# Patient Record
Sex: Male | Born: 1945 | Race: White | Hispanic: No | Marital: Married | Smoking: Current every day smoker
Health system: Southern US, Community
[De-identification: ages and names within clinical notes are randomized; demographics above are authoritative.]

## PROBLEM LIST (undated history)

## (undated) DIAGNOSIS — Z8679 Personal history of other diseases of the circulatory system: Secondary | ICD-10-CM

## (undated) DIAGNOSIS — J449 Chronic obstructive pulmonary disease, unspecified: Secondary | ICD-10-CM

## (undated) DIAGNOSIS — I10 Essential (primary) hypertension: Secondary | ICD-10-CM

## (undated) DIAGNOSIS — E7800 Pure hypercholesterolemia, unspecified: Secondary | ICD-10-CM

## (undated) DIAGNOSIS — E669 Obesity, unspecified: Secondary | ICD-10-CM

## (undated) DIAGNOSIS — M1990 Unspecified osteoarthritis, unspecified site: Secondary | ICD-10-CM

## (undated) DIAGNOSIS — R0789 Other chest pain: Secondary | ICD-10-CM

## (undated) DIAGNOSIS — Z86010 Personal history of colonic polyps: Secondary | ICD-10-CM

## (undated) DIAGNOSIS — G4730 Sleep apnea, unspecified: Secondary | ICD-10-CM

## (undated) HISTORY — PX: ANTRAL WINDOW: SHX5192

## (undated) HISTORY — PX: UMBILICAL HERNIA REPAIR: SHX196

## (undated) HISTORY — PX: KNEE ARTHROSCOPY: SUR90

## (undated) HISTORY — PX: FUNCTIONAL ENDOSCOPIC SINUS SURGERY: SUR616

## (undated) HISTORY — PX: HERNIA REPAIR: SHX51

## (undated) HISTORY — PX: JOINT REPLACEMENT: SHX530

## (undated) HISTORY — PX: TONSILLECTOMY: SUR1361

---

## 2005-09-28 ENCOUNTER — Ambulatory Visit: Payer: Self-pay | Admitting: Internal Medicine

## 2009-11-02 ENCOUNTER — Ambulatory Visit: Payer: Self-pay | Admitting: Internal Medicine

## 2010-06-05 ENCOUNTER — Inpatient Hospital Stay: Payer: Self-pay | Admitting: Internal Medicine

## 2010-08-29 ENCOUNTER — Ambulatory Visit: Payer: Self-pay | Admitting: Surgery

## 2010-09-06 ENCOUNTER — Ambulatory Visit: Payer: Self-pay | Admitting: Gastroenterology

## 2010-09-15 ENCOUNTER — Ambulatory Visit: Payer: Self-pay | Admitting: Surgery

## 2010-09-21 ENCOUNTER — Ambulatory Visit: Payer: Self-pay | Admitting: Surgery

## 2010-09-26 LAB — PATHOLOGY REPORT

## 2011-05-24 ENCOUNTER — Ambulatory Visit: Payer: Self-pay

## 2011-10-05 ENCOUNTER — Ambulatory Visit: Payer: Self-pay

## 2014-09-08 ENCOUNTER — Ambulatory Visit: Payer: Self-pay | Admitting: Internal Medicine

## 2015-01-10 ENCOUNTER — Emergency Department
Admit: 2015-01-10 | Disposition: A | Discharge status: Home / Self Care | Payer: Self-pay | Admitting: Emergency Medicine

## 2015-02-18 ENCOUNTER — Ambulatory Visit
Admission: RE | Admit: 2015-02-18 | Discharge: 2015-02-18 | Disposition: A | Discharge status: Home / Self Care | Payer: BLUE CROSS/BLUE SHIELD | Source: Ambulatory Visit | Attending: Gastroenterology | Admitting: Gastroenterology

## 2015-02-18 ENCOUNTER — Encounter: Payer: Self-pay | Admitting: *Deleted

## 2015-02-18 ENCOUNTER — Ambulatory Visit: Payer: BLUE CROSS/BLUE SHIELD | Admitting: Anesthesiology

## 2015-02-18 ENCOUNTER — Encounter
Admission: RE | Disposition: A | Discharge status: Home / Self Care | Payer: Self-pay | Source: Ambulatory Visit | Attending: Gastroenterology

## 2015-02-18 DIAGNOSIS — D124 Benign neoplasm of descending colon: Secondary | ICD-10-CM | POA: Insufficient documentation

## 2015-02-18 DIAGNOSIS — D175 Benign lipomatous neoplasm of intra-abdominal organs: Secondary | ICD-10-CM | POA: Insufficient documentation

## 2015-02-18 DIAGNOSIS — D125 Benign neoplasm of sigmoid colon: Secondary | ICD-10-CM | POA: Diagnosis not present

## 2015-02-18 DIAGNOSIS — Z86010 Personal history of colonic polyps: Secondary | ICD-10-CM | POA: Insufficient documentation

## 2015-02-18 DIAGNOSIS — I10 Essential (primary) hypertension: Secondary | ICD-10-CM | POA: Diagnosis not present

## 2015-02-18 DIAGNOSIS — E780 Pure hypercholesterolemia: Secondary | ICD-10-CM | POA: Insufficient documentation

## 2015-02-18 DIAGNOSIS — G4730 Sleep apnea, unspecified: Secondary | ICD-10-CM | POA: Insufficient documentation

## 2015-02-18 DIAGNOSIS — I4891 Unspecified atrial fibrillation: Secondary | ICD-10-CM | POA: Insufficient documentation

## 2015-02-18 HISTORY — DX: Pure hypercholesterolemia, unspecified: E78.00

## 2015-02-18 HISTORY — DX: Sleep apnea, unspecified: G47.30

## 2015-02-18 HISTORY — PX: COLONOSCOPY: SHX5424

## 2015-02-18 HISTORY — DX: Essential (primary) hypertension: I10

## 2015-02-18 SURGERY — COLONOSCOPY
Anesthesia: General

## 2015-02-18 MED ORDER — PROPOFOL INFUSION 10 MG/ML OPTIME
INTRAVENOUS | Status: DC | PRN
  Administered 2015-02-18: 140 ug/kg/min via INTRAVENOUS

## 2015-02-18 MED ORDER — LIDOCAINE HCL (CARDIAC) 20 MG/ML IV SOLN
INTRAVENOUS | Status: DC | PRN
Start: 2015-02-18 — End: 2015-02-18
  Administered 2015-02-18: 50 mg via INTRAVENOUS

## 2015-02-18 MED ORDER — PROPOFOL 10 MG/ML IV BOLUS
INTRAVENOUS | Status: DC | PRN
  Administered 2015-02-18: 100 mg via INTRAVENOUS

## 2015-02-18 MED ORDER — SODIUM CHLORIDE 0.9 % IV SOLN: INTRAVENOUS | Status: DC

## 2015-02-18 MED ORDER — LACTATED RINGERS IV SOLN
INTRAVENOUS | Status: DC | PRN
  Administered 2015-02-18: 08:00:00 via INTRAVENOUS

## 2015-02-18 NOTE — Op Note (Signed)
[facility] Gastroenterology Patient Name: Daniel Boyer Procedure Date: 02/18/2015 8:02 AM MRN: [id number] Account #: [id number] Date of Birth: Sep 28, 1946 Admit Type: Outpatient Age: 69 Room: [facility] ENDO ROOM 4 Gender: Male Note Status: Finalized Procedure:         Colonoscopy Indications:       Personal history of colonic polyps Providers:         Lupita Dawn. Candace Cruise, MD Referring MD:      Leonie Douglas. Doy Hutching, MD (Referring MD) Medicines:         Monitored Anesthesia Care Procedure:         Pre-Anesthesia Assessment:                    - Prior to the procedure, a History and Physical was                     performed, and patient medications, allergies and                     sensitivities were reviewed. The patient's tolerance of                     previous anesthesia was reviewed.                    - The risks and benefits of the procedure and the sedation                     options and risks were discussed with the patient. All                     questions were answered and informed consent was obtained.                    - After reviewing the risks and benefits, the patient was                     deemed in satisfactory condition to undergo the procedure.                    After obtaining informed consent, the colonoscope was                     passed under direct vision. Throughout the procedure, the                     patient's blood pressure, pulse, and oxygen saturations                     were monitored continuously. The Colonoscope was                     introduced through the anus and advanced to the the cecum,                     identified by appendiceal orifice and ileocecal valve. The                     colonoscopy was performed without difficulty. The patient                     tolerated the procedure well. The quality of the bowel  preparation was good. Findings:      A medium polyp was found in the descending colon. The polyp  was       pedunculated. The polyp was removed with a hot snare. Resection and       retrieval were complete.      There was a small lipoma, in the ascending colon.      A small polypoid lesion was found in the sigmoid colon. May just be a       prominnt fold. The lesion was sessile. The polyp was removed with a hot       snare. Polyp resection was incomplete. The resected tissue was retrieved.      The exam was otherwise without abnormality. Impression:        - One medium polyp in the descending colon. Resected and                     retrieved.                    - Small lipoma in the ascending colon.                    - Benign fold in the sigmoid colon. Tissue was removed.                    - The examination was otherwise normal. Recommendation:    - Discharge patient to home.                    - Await pathology results.                    - Repeat colonoscopy in 5 years for surveillance.                    - The findings and recommendations were discussed with the                     patient. Procedure Code(s): --- Professional ---                    [phone], Colonoscopy, flexible; with removal of tumor(s),                     polyp(s), or other lesion(s) by snare technique Diagnosis Code(s): --- Professional ---                    D12.4, Benign neoplasm of descending colon                    D17.5, Benign lipomatous neoplasm of intra-abdominal organs                    D12.5, Benign neoplasm of sigmoid colon                    Z86.010, Personal history of colonic polyps CPT copyright 2014 [employer]. All rights reserved. The codes documented in this report are preliminary and upon coder review may  be revised to meet current compliance requirements. Hulen Luster, MD 02/18/2015 8:30:57 AM This report has been signed electronically. Number of Addenda: 0 Note Initiated On: 02/18/2015 8:02 AM Scope Withdrawal Time: 0 hours 13 minutes 34 seconds  Total Procedure  Duration: 0 hours 15 minutes 38 seconds       [facility]

## 2015-02-18 NOTE — Anesthesia Postprocedure Evaluation (Signed)
  Anesthesia Post-op Note  Patient: Daniel Boyer.  Procedure(s) Performed: Procedure(s): COLONOSCOPY (N/A)  Anesthesia type:General  Patient location: PACU  Post pain: Pain level controlled  Post assessment: Post-op Vital signs reviewed, Patient's Cardiovascular Status Stable, Respiratory Function Stable, Patent Airway and No signs of Nausea or vomiting  Post vital signs: Reviewed and stable  Last Vitals:  Filed Vitals:   02/18/15 0835  BP: 109/63  Pulse: 75  Temp: 35.8 C  Resp: 17    Level of consciousness: awake, alert  and patient cooperative  Complications: No apparent anesthesia complications

## 2015-02-18 NOTE — H&P (Signed)
    Primary Care Physician:  Idelle Crouch, MD Primary Gastroenterologist:  Dr. Candace Cruise  Pre-Procedure History & Physical: HPI:  Daniel Boyer. is a 69 y.o. male is here for colonoscopy.   Past Medical History  Diagnosis Date  . Hypertension   . Sleep apnea   . Hypercholesterolemia     Past Surgical History  Procedure Laterality Date  . Umbilical hernia repair    . Knee arthroscopy    . Antral window      Prior to Admission medications   Medication Sig Start Date End Date Taking? Authorizing Provider  aspirin 81 MG chewable tablet Chew 81 mg by mouth daily.   Yes Historical Provider, MD  Fish Oil OIL Take 1 tablet by mouth daily.   Yes Historical Provider, MD  fluticasone (FLONASE) 50 MCG/ACT nasal spray Place 1 spray into both nostrils daily.   Yes Historical Provider, MD  GAVILYTE-N WITH FLAVOR PACK 420 G solution Take by mouth. Colonoscopy prep 01/20/15  Yes Historical Provider, MD  glucosamine-chondroitin 500-400 MG tablet Take 2 tablets by mouth daily.   Yes Historical Provider, MD  metoprolol succinate (TOPROL-XL) 100 MG 24 hr tablet Take 1 tablet by mouth daily. 01/17/15  Yes Historical Provider, MD  pravastatin (PRAVACHOL) 40 MG tablet Take 40 mg by mouth daily. 01/17/15  Yes Historical Provider, MD    Allergies as of 01/26/2015  . (Not on File)    No family history on file.  History   Social History  . Marital Status: Married    Spouse Name: N/A  . Number of Children: N/A  . Years of Education: N/A   Occupational History  . Not on file.   Social History Main Topics  . Smoking status: Not on file  . Smokeless tobacco: Not on file  . Alcohol Use: Not on file  . Drug Use: Not on file  . Sexual Activity: Not on file   Other Topics Concern  . Not on file   Social History Narrative  . No narrative on file    Review of Systems: See HPI, otherwise negative ROS  Physical Exam: BP 119/56 mmHg  Pulse 87  Temp(Src) 97.2 F (36.2 C) (Tympanic)  Resp 20   Ht 6' (1.829 m)  Wt 143.79 kg (317 lb)  BMI 42.98 kg/m2  SpO2 94% General:   Alert,  pleasant and cooperative in NAD Head:  Normocephalic and atraumatic. Neck:  Supple; no masses or thyromegaly. Lungs:  Clear throughout to auscultation.    Heart:  Regular rate and rhythm. Abdomen:  Soft, nontender and nondistended. Normal bowel sounds, without guarding, and without rebound.   Neurologic:  Alert and  oriented x4;  grossly normal neurologically.  Impression/Plan: Daniel Boyer. is here for colonoscopy to be performed for personal hx of colon polyps.  Risks, benefits, limitations, and alternatives regarding colonoscopy have been reviewed with the patient.  Questions have been answered.  All parties agreeable.   Daniel Boyer, Daniel Dawn, MD  02/18/2015, 7:58 AM

## 2015-02-18 NOTE — Anesthesia Preprocedure Evaluation (Addendum)
Anesthesia Evaluation  Patient identified by MRN, date of birth, ID band Patient awake    Reviewed: Allergy & Precautions, NPO status , Patient's Chart, lab work & pertinent test results  Airway Mallampati: II  TM Distance: >3 FB Neck ROM: Full    Dental   Pulmonary sleep apnea and Continuous Positive Airway Pressure Ventilation ,          Cardiovascular hypertension, Pt. on medications and Pt. on home beta blockers + dysrhythmias (afib with the flu) Atrial Fibrillation     Neuro/Psych    GI/Hepatic   Endo/Other    Renal/GU      Musculoskeletal   Abdominal   Peds  Hematology   Anesthesia Other Findings   Reproductive/Obstetrics                            Anesthesia Physical Anesthesia Plan  ASA: III  Anesthesia Plan: General   Post-op Pain Management:    Induction: Intravenous  Airway Management Planned: Nasal Cannula  Additional Equipment:   Intra-op Plan:   Post-operative Plan:   Informed Consent: I have reviewed the patients History and Physical, chart, labs and discussed the procedure including the risks, benefits and alternatives for the proposed anesthesia with the patient or authorized representative who has indicated his/her understanding and acceptance.     Plan Discussed with:   Anesthesia Plan Comments:         Anesthesia Quick Evaluation

## 2015-02-18 NOTE — Transfer of Care (Signed)
Immediate Anesthesia Transfer of Care Note  Patient: Daniel Boyer.  Procedure(s) Performed: Procedure(s): COLONOSCOPY (N/A)  Patient Location: PACU and Endoscopy Unit  Anesthesia Type:General  Level of Consciousness: awake and alert   Airway & Oxygen Therapy: Patient Spontanous Breathing and Patient connected to nasal cannula oxygen  Post-op Assessment: Report given to RN and Post -op Vital signs reviewed and stable  Post vital signs: stable  Last Vitals:  Filed Vitals:   02/18/15 0835  BP: 109/63  Pulse: 75  Temp: 35.8 C  Resp: 17    Complications: No apparent anesthesia complications

## 2015-02-21 LAB — SURGICAL PATHOLOGY

## 2015-03-01 ENCOUNTER — Encounter: Payer: Self-pay | Admitting: Gastroenterology

## 2016-02-09 ENCOUNTER — Other Ambulatory Visit: Payer: Self-pay | Admitting: Orthopedic Surgery

## 2016-02-09 DIAGNOSIS — M1711 Unilateral primary osteoarthritis, right knee: Secondary | ICD-10-CM

## 2016-02-28 ENCOUNTER — Ambulatory Visit
Admission: RE | Admit: 2016-02-28 | Discharge: 2016-02-28 | Disposition: A | Discharge status: Home / Self Care | Payer: BLUE CROSS/BLUE SHIELD | Source: Ambulatory Visit | Attending: Orthopedic Surgery | Admitting: Orthopedic Surgery

## 2016-02-28 DIAGNOSIS — M1711 Unilateral primary osteoarthritis, right knee: Secondary | ICD-10-CM

## 2016-03-12 ENCOUNTER — Other Ambulatory Visit: Payer: Self-pay | Admitting: Physician Assistant

## 2016-03-12 ENCOUNTER — Ambulatory Visit
Admission: RE | Admit: 2016-03-12 | Discharge: 2016-03-12 | Disposition: A | Discharge status: Home / Self Care | Payer: BLUE CROSS/BLUE SHIELD | Source: Ambulatory Visit | Attending: Physician Assistant | Admitting: Physician Assistant

## 2016-03-12 DIAGNOSIS — R1032 Left lower quadrant pain: Secondary | ICD-10-CM | POA: Diagnosis not present

## 2016-03-12 DIAGNOSIS — R935 Abnormal findings on diagnostic imaging of other abdominal regions, including retroperitoneum: Secondary | ICD-10-CM | POA: Diagnosis not present

## 2016-03-12 MED ORDER — IOPAMIDOL (ISOVUE-300) INJECTION 61%: 125.0000 mL | Freq: Once | INTRAVENOUS | Status: DC | PRN

## 2016-03-12 MED ORDER — IOPAMIDOL (ISOVUE-300) INJECTION 61%
125.0000 mL | Freq: Once | INTRAVENOUS | Status: AC | PRN
Start: 2016-03-12 — End: 2016-03-12
  Administered 2016-03-12: 125 mL via INTRAVENOUS

## 2016-05-09 ENCOUNTER — Encounter
Admission: RE | Admit: 2016-05-09 | Discharge: 2016-05-09 | Disposition: A | Discharge status: Home / Self Care | Payer: BLUE CROSS/BLUE SHIELD | Source: Ambulatory Visit | Attending: Orthopedic Surgery | Admitting: Orthopedic Surgery

## 2016-05-09 DIAGNOSIS — Z01812 Encounter for preprocedural laboratory examination: Secondary | ICD-10-CM | POA: Insufficient documentation

## 2016-05-09 HISTORY — DX: Unspecified osteoarthritis, unspecified site: M19.90

## 2016-05-09 LAB — SEDIMENTATION RATE: Sed Rate: 26 mm/hr — ABNORMAL HIGH (ref 0–20)

## 2016-05-09 LAB — URINALYSIS COMPLETE WITH MICROSCOPIC (ARMC ONLY)
Bacteria, UA: NONE SEEN
Bilirubin Urine: NEGATIVE
Glucose, UA: NEGATIVE mg/dL
HGB URINE DIPSTICK: NEGATIVE
KETONES UR: NEGATIVE mg/dL
LEUKOCYTES UA: NEGATIVE
Nitrite: NEGATIVE
PH: 5 (ref 5.0–8.0)
Protein, ur: NEGATIVE mg/dL
RBC / HPF: NONE SEEN RBC/hpf (ref 0–5)
SQUAMOUS EPITHELIAL / LPF: NONE SEEN
Specific Gravity, Urine: 1.012 (ref 1.005–1.030)

## 2016-05-09 LAB — BASIC METABOLIC PANEL
Anion gap: 6 (ref 5–15)
BUN: 14 mg/dL (ref 6–20)
CHLORIDE: 108 mmol/L (ref 101–111)
CO2: 25 mmol/L (ref 22–32)
Calcium: 8.6 mg/dL — ABNORMAL LOW (ref 8.9–10.3)
Creatinine, Ser: 0.92 mg/dL (ref 0.61–1.24)
GFR calc Af Amer: >60 mL/min (ref >60)
GFR calc non Af Amer: >60 mL/min (ref >60)
GLUCOSE: 89 mg/dL (ref 65–99)
POTASSIUM: 3.8 mmol/L (ref 3.5–5.1)
Sodium: 139 mmol/L (ref 135–145)

## 2016-05-09 LAB — CBC
HEMATOCRIT: 43.3 % (ref 40.0–52.0)
HEMOGLOBIN: 15.3 g/dL (ref 13.0–18.0)
MCH: 29.4 pg (ref 26.0–34.0)
MCHC: 35.4 g/dL (ref 32.0–36.0)
MCV: 83.2 fL (ref 80.0–100.0)
Platelets: 198 10*3/uL (ref 150–440)
RBC: 5.2 MIL/uL (ref 4.40–5.90)
RDW: 13.5 % (ref 11.5–14.5)
WBC: 9.1 10*3/uL (ref 3.8–10.6)

## 2016-05-09 LAB — SURGICAL PCR SCREEN
MRSA, PCR: NEGATIVE
STAPHYLOCOCCUS AUREUS: NEGATIVE

## 2016-05-09 LAB — PROTIME-INR
INR: 0.93
PROTHROMBIN TIME: 12.5 s (ref 11.4–15.2)

## 2016-05-09 LAB — TYPE AND SCREEN
ABO/RH(D): O POS
Antibody Screen: NEGATIVE

## 2016-05-09 LAB — APTT: APTT: 30 s (ref 24–36)

## 2016-05-09 NOTE — Pre-Procedure Instructions (Signed)
Component Name Value Ref Range  Vent Rate (bpm) 69   PR Interval (msec) 186   QRS Interval (msec) 86   QT Interval (msec) 392   QTc (msec) 420   Result Narrative  Normal sinus rhythm Possible Anterior infarct , age undetermined Abnormal ECG No previous ECGs available I reviewed and concur with this report. Electronically signed [id number] MD, Darnell Level [phone]) on 05/02/2016 1:48:30 PM

## 2016-05-09 NOTE — Patient Instructions (Signed)
  Your procedure is scheduled on: 05/22/16 Tues Report to Same Day Surgery 2nd floor medical mall To find out your arrival time please call [phone] between 1PM - 3PM on 05/21/16 Mon  Remember: Instructions that are not followed completely may result in serious medical risk, up to and including death, or upon the discretion of your surgeon and anesthesiologist your surgery may need to be rescheduled.    _x___ 1. Do not eat food or drink liquids after midnight. No gum chewing or hard candies.     __x__ 2. No Alcohol for 24 hours before or after surgery.   __x__3. No Smoking for 24 prior to surgery.   ____  4. Bring all medications with you on the day of surgery if instructed.    __x__ 5. Notify your doctor if there is any change in your medical condition     (cold, fever, infections).     Do not wear jewelry, make-up, hairpins, clips or nail polish.  Do not wear lotions, powders, or perfumes. You may wear deodorant.  Do not shave 48 hours prior to surgery. Men may shave face and neck.  Do not bring valuables to the hospital.    [facility] is not responsible for any belongings or valuables.               Contacts, dentures or bridgework may not be worn into surgery.  Leave your suitcase in the car. After surgery it may be brought to your room.  For patients admitted to the hospital, discharge time is determined by your treatment team.   Patients discharged the day of surgery will not be allowed to drive home.    Please read over the following fact sheets that you were given:   [facility] Preparing for Surgery and or MRSA Information   _x___ Take these medicines the morning of surgery with A SIP OF WATER:    1. metoprolol succinate (TOPROL-XL) 100 MG 24 hr tablet  2.  3.  4.  5.  6.  ____ Fleet Enema (as directed)   _x___ Use CHG Soap or sage wipes as directed on instruction sheet   ____ Use inhalers on the day of surgery and bring to hospital day of surgery  ____  Stop metformin 2 days prior to surgery    ____ Take 1/2 of usual insulin dose the night before surgery and none on the morning of           surgery.   __x__ Stop aspirin or coumadin, or plavix Stop aspirin 1 week before surgery  _x__ Stop Anti-inflammatories such as Advil, Aleve, Ibuprofen, Motrin, Naproxen,          Naprosyn, Goodies powders or aspirin products. Ok to take Tylenol.   _x___ Stop supplements until after surgery.  Stop fish oil 1 week before surgery  ____ Bring C-Pap to the hospital.

## 2016-05-09 NOTE — Pre-Procedure Instructions (Signed)
[name], MD - 04/24/2016 3:15 PM EDT Formatting of this note may be different from the original. Established Patient Visit   Chief Complaint: Chief Complaint  Patient presents with  . Follow-up  POC total knee replacement  . Atrial Fibrillation  . Hypertension  Date of Service: 04/24/2016 Date of Birth: 05-18-1946 PCP: Idelle Crouch, MD, MD  History of Present Illness: Daniel Boyer is a 70 y.o.male patient  Atrial flutter The patient has non valvular atrial flutter which is Paroxysmal symptomatic with shortness of breath and dizziness currently taking metoprolol and Maintaining normal sinus rhythm. The atrial flutter has occurred at rest and may be associated with a fluttering sensation stable. The etiology to atrial flutter include Hypertension, structural heart disease and or LV dysfunction, valvular heart disease, lung disease and/or sleep apnea and sick sinus syndrome The patient has not been taking anticoagulation Tobacco use The patient was counseled on the risks of tobacco use and significant hazards. They have been informed of the continued cardiovascular risk of stroke, heart attack, and peripheral vascular disease. We have continued to stress the need for discontinuation and/or abstinence if able.  Sleep Apnea The patient has had a diagnosis of sleep apnea confirmed by formal sleep study in the past. The patient has been placed on a CPAP machine for which they have been diligent in its use. Recent evaluation has shown 100% compliance with an average of more than 7 hours of use per night. The patient has had significant benefit from the use of the CPAP machine with considerable improvements in quality of life, work production, and decreased medical complaints. The patient will need continued maintenance and care supplies as well as upgrades at appropriate times to continue helping with treatment of this diagnosis. Obesity The patient has been identified with class III (BMI  >40) obesity. There is concerns that the patient has a higher risk for cardiovascular disease, diabetes, morbidity, and mortality. We have discussed these concerns and the possible treatment options including options for weight loss. It is clear that even 3-5% loss in weight will lower these risks. We have also identified risk factors to modify and treat including sleep apnea, hypertention and hyperlipidemia  Perioperative RCRI Risk for Complications with Knee Surgery 1. High risk surgery (intraperitoneal, intrathoracic, Suprainguinal vascular)-no 2. History of make heart disease (history of myocardial infarction, history of positive exercise test, current chest pain considered due to myocardial ischemia, use of nitrate therapy, EKG changes with pathological Q-waves)-no 3. History of congestive heart failure (pulmonary edema, bilateral rales and or S3 gallop, paroxysmal nocturnal dyspnea, chest x-ray showing pulmonary vascular distribution)-no 4. History of cerebral vascular disease (prior TIA and/or stroke)-no 5. Pre-operative treatment with insulin-no 6. Pre-operative creatinine greater than 2 milligrams/deciliter-"no Resultant risk classification: Class I risk, 0.4% risk of major cardiac event  Past Medical and Surgical History  Past Medical History Past Medical History:  Diagnosis Date  . Atrial flutter , unspecified (CMS-HCC)  . COPD (chronic obstructive pulmonary disease) (CMS-HCC)  . Hyperlipidemia  . Hypertension  benign  . Non-cardiac chest pain  . Obesity  . Sleep apnea  on BIPAP  . Tobacco abuse  . 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   Medications and Allergies  Current Medications  Current Outpatient Prescriptions  Medication Sig Dispense Refill   . aspirin 81 MG EC  tablet Take 81 mg by mouth once daily.  . fluticasone (FLONASE) 50 mcg/actuation nasal spray Place 2 sprays into both nostrils once daily as needed for Rhinitis or Allergies. 16 g 3  . ibuprofen (ADVIL,MOTRIN) 200 MG tablet Take 400 mg by mouth as needed for Pain.  . metoprolol succinate (TOPROL-XL) 100 MG XL tablet take 1 tablet by mouth once daily 30 tablet 11  . omega-3 fatty acids/fish oil 340-1,000 mg capsule Take 1 capsule by mouth 2 (two) times daily.  . pravastatin (PRAVACHOL) 40 MG tablet take 1 tablet by mouth EVERY NIGHT 90 tablet 3  . predniSONE (DELTASONE) 10 MG tablet Taper 6-5-4-3-2-1-off 21 tablet 0  . sildenafil (REVATIO) 20 mg tablet 2-3 tablets daily as needed 30 tablet 3   No current facility-administered medications for this visit.   Allergies: Celebrex [celecoxib]  Social and Family History  Social History reports that he has been smoking Cigarettes. He has been smoking about 1.00 pack per day. He has never used smokeless tobacco. He reports that he does not drink alcohol or use illicit drugs.  Family History Family History  Problem Relation Age of Onset  . Heart attack Mother  . Heart attack Father  . Diabetes type II Father  . Coronary artery disease Other  . Diabetes type II Other  . Stroke Other   Review of Systems   Review of Systems  Positive for knee pain Negative for weight gain weight loss, weakness, vision change, hearing loss, cough, congestion, PND, orthopnea, heartburn, nausea, diaphoresis, vomiting, diarrhea, bloody stool, melena, stomach pain, leg cramping, leg blood clots, headache, blackouts, nosebleed, trouble swallowing, mouth pain, urinary frequency, urination at night, muscle weakness, skin lesions, skin rashes, tingling ,ulcers, numbness, anxiety, and/or depression Physical Examination   Vitals:BP 128/80  Pulse 69  Resp 14  Ht 177.8 cm (5\' 10" )  Wt (!) 143.8 kg (317 lb)  BMI 45.48 kg/m2 Ht:177.8 cm (5\' 10" )  Wt:(!) 143.8 kg (317 lb) [id number] surface area is 2.66 meters squared. Body mass index is 45.48 kg/(m^2). Appearance: well appearing in no acute distress HEENT: Pupils equally reactive to light and accomodation, no xanthalasma  Neck: Supple, no apparent thyromegaly, masses, or lymphadenopathy  Lungs: normal respiratory effort; no crackles, no rhonchi, no wheezes Heart: Regular rate and rhythm. Normal S1 S2 No gallops, murmur, no rub, PMI is normal size and placement. carotid upstroke normal Jugular venous pressure is normal Abdomen: soft, nontender, with normal bowel sounds. No apparent hepatosplenomegally. Abdominal aorta is normal size without bruit Extremities: no edema, no ulcers, no clubbing, no cyanosis Peripheral Pulses: 2+ in upper extremities, 1+ femoral pulses bilaterally, 0+lower extremity  Musculoskeletal; Normal muscle tone without kyphosis Neurological: Oriented and Alert, Cranial nerves intact  Assessment   70 y.o. male with  Encounter Diagnoses  Name Primary?  . Pre-operative clearance  . Atrial flutter, paroxysmal , unspecified (CMS-HCC)  . Morbid obesity due to excess calories (CMS-HCC) Yes  . OSA (obstructive sleep apnea)  . Tobacco abuse   Plan  -Proceed to surgery and/or invasive procedure without restriction to pre or post operative and/or procedural care. The patient is at lowest risk possible for cardiovascular complications with surgical intervention and/or invasive procedure. Currently has no evidence active and/or significant angina and/or congestive heart failure. The patient may discontinue aspirin 4 days prior to procedure and restart at a safe period thereafter -The patient will use metoprolol succinate (Toprol XL) for heart rate and or maintenance of normal sinus of atrial flutter. -Patient has had  council today on the significance of all forms of tobacco use and the related hazards. We have expressed the need to abstain from the use of tobacco products for  prevention of cardiovascular disease, peripheral vascular disease, and of cardiovascular events. -Diligent use of the CPAP machine for further risk reduction of cardiovascular event, congestive heart failure, shortness of breath, pulmonary hypertension, and lower extremity edema has been discussed today. The patient understands the importance of treatment of sleep apnea and will continue to work diligently on other measures including weight loss and exercise. -We have been able to council the patient on the concerns of increased morbidity and mortality of obesity and the steps that can be taken to reduce risk. This includes a lifestyle program, an exercise program, a diet program, and medical management of current cardiovascular risk factors.  Orders Placed This Encounter  Procedures  . ECG 12-lead   Return in about 6 months (around 10/25/2016).  [name], MD

## 2016-05-10 LAB — URINE CULTURE: Culture: NO GROWTH

## 2016-05-11 NOTE — Pre-Procedure Instructions (Signed)
Calcium level and sed rate sent to Dr. Rudene Christians and Anesthesia for review.

## 2016-05-16 ENCOUNTER — Other Ambulatory Visit: Payer: BLUE CROSS/BLUE SHIELD

## 2016-05-22 ENCOUNTER — Inpatient Hospital Stay
Admission: RE | Admit: 2016-05-22 | Discharge: 2016-05-24 | DRG: 470 | Disposition: A | Discharge status: Home Health | Payer: BLUE CROSS/BLUE SHIELD | Source: Ambulatory Visit | Attending: Orthopedic Surgery | Admitting: Orthopedic Surgery

## 2016-05-22 ENCOUNTER — Inpatient Hospital Stay: Payer: BLUE CROSS/BLUE SHIELD | Admitting: Anesthesiology

## 2016-05-22 ENCOUNTER — Encounter
Admission: RE | Disposition: A | Discharge status: Home Health | Payer: Self-pay | Source: Ambulatory Visit | Attending: Orthopedic Surgery

## 2016-05-22 ENCOUNTER — Inpatient Hospital Stay: Payer: BLUE CROSS/BLUE SHIELD

## 2016-05-22 DIAGNOSIS — F17200 Nicotine dependence, unspecified, uncomplicated: Secondary | ICD-10-CM | POA: Diagnosis present

## 2016-05-22 DIAGNOSIS — Z8249 Family history of ischemic heart disease and other diseases of the circulatory system: Secondary | ICD-10-CM

## 2016-05-22 DIAGNOSIS — Z79899 Other long term (current) drug therapy: Secondary | ICD-10-CM

## 2016-05-22 DIAGNOSIS — G8918 Other acute postprocedural pain: Secondary | ICD-10-CM

## 2016-05-22 DIAGNOSIS — J449 Chronic obstructive pulmonary disease, unspecified: Secondary | ICD-10-CM | POA: Diagnosis present

## 2016-05-22 DIAGNOSIS — M1711 Unilateral primary osteoarthritis, right knee: Secondary | ICD-10-CM | POA: Diagnosis present

## 2016-05-22 DIAGNOSIS — M25561 Pain in right knee: Secondary | ICD-10-CM | POA: Diagnosis present

## 2016-05-22 DIAGNOSIS — E785 Hyperlipidemia, unspecified: Secondary | ICD-10-CM | POA: Diagnosis present

## 2016-05-22 DIAGNOSIS — Z7982 Long term (current) use of aspirin: Secondary | ICD-10-CM

## 2016-05-22 DIAGNOSIS — I10 Essential (primary) hypertension: Secondary | ICD-10-CM | POA: Diagnosis present

## 2016-05-22 DIAGNOSIS — G4730 Sleep apnea, unspecified: Secondary | ICD-10-CM | POA: Diagnosis present

## 2016-05-22 DIAGNOSIS — Z888 Allergy status to other drugs, medicaments and biological substances status: Secondary | ICD-10-CM | POA: Diagnosis not present

## 2016-05-22 HISTORY — PX: TOTAL KNEE ARTHROPLASTY: SHX125

## 2016-05-22 LAB — CBC
HEMATOCRIT: 42 % (ref 40.0–52.0)
Hemoglobin: 14.7 g/dL (ref 13.0–18.0)
MCH: 29.5 pg (ref 26.0–34.0)
MCHC: 35 g/dL (ref 32.0–36.0)
MCV: 84.1 fL (ref 80.0–100.0)
PLATELETS: 175 10*3/uL (ref 150–440)
RBC: 4.99 MIL/uL (ref 4.40–5.90)
RDW: 13.6 % (ref 11.5–14.5)
WBC: 9 10*3/uL (ref 3.8–10.6)

## 2016-05-22 LAB — CREATININE, SERUM
Creatinine, Ser: 0.81 mg/dL (ref 0.61–1.24)
GFR calc Af Amer: >60 mL/min (ref >60)
GFR calc non Af Amer: >60 mL/min (ref >60)

## 2016-05-22 SURGERY — ARTHROPLASTY, KNEE, TOTAL
Anesthesia: Spinal | Site: Knee | Laterality: Right | Wound class: Clean

## 2016-05-22 MED ORDER — FENTANYL CITRATE (PF) 100 MCG/2ML IJ SOLN
25.0000 ug | INTRAMUSCULAR | Status: AC | PRN
  Administered 2016-05-22 (×6): 25 ug via INTRAVENOUS

## 2016-05-22 MED ORDER — METOPROLOL SUCCINATE ER 50 MG PO TB24
100.0000 mg | ORAL_TABLET | Freq: Every day | ORAL | Status: DC
Start: 2016-05-23 — End: 2016-05-24
  Administered 2016-05-23 – 2016-05-24 (×2): 100 mg via ORAL
  Filled 2016-05-22 (×2): qty 2

## 2016-05-22 MED ORDER — BUPIVACAINE-EPINEPHRINE (PF) 0.25% -1:200000 IJ SOLN
INTRAMUSCULAR | Status: DC | PRN
  Administered 2016-05-22: 30 mL

## 2016-05-22 MED ORDER — LACTATED RINGERS IV SOLN
INTRAVENOUS | Status: DC
  Administered 2016-05-22 (×2): via INTRAVENOUS

## 2016-05-22 MED ORDER — PRAVASTATIN SODIUM 40 MG PO TABS
40.0000 mg | ORAL_TABLET | Freq: Every day | ORAL | Status: DC
  Administered 2016-05-22 – 2016-05-23 (×2): 40 mg via ORAL
  Filled 2016-05-22 (×2): qty 1

## 2016-05-22 MED ORDER — CEFAZOLIN SODIUM 10 G IJ SOLR
3.0000 g | Freq: Four times a day (QID) | INTRAMUSCULAR | Status: AC
  Administered 2016-05-22 – 2016-05-23 (×3): 3 g via INTRAVENOUS
  Filled 2016-05-22 (×3): qty 3000

## 2016-05-22 MED ORDER — SODIUM CHLORIDE 0.9 % IJ SOLN
INTRAMUSCULAR | Status: AC
  Filled 2016-05-22: qty 100

## 2016-05-22 MED ORDER — SODIUM CHLORIDE 0.9 % IJ SOLN
INTRAMUSCULAR | Status: DC | PRN
  Administered 2016-05-22: 30 mL

## 2016-05-22 MED ORDER — ONDANSETRON HCL 4 MG/2ML IJ SOLN: 4.0000 mg | Freq: Once | INTRAMUSCULAR | Status: DC | PRN

## 2016-05-22 MED ORDER — ACETAMINOPHEN 650 MG RE SUPP: 650.0000 mg | Freq: Four times a day (QID) | RECTAL | Status: DC | PRN

## 2016-05-22 MED ORDER — ASPIRIN 81 MG PO CHEW
81.0000 mg | CHEWABLE_TABLET | Freq: Every day | ORAL | Status: DC
  Administered 2016-05-22 – 2016-05-24 (×3): 81 mg via ORAL
  Filled 2016-05-22 (×3): qty 1

## 2016-05-22 MED ORDER — MAGNESIUM HYDROXIDE 400 MG/5ML PO SUSP
30.0000 mL | Freq: Every day | ORAL | Status: DC | PRN
  Administered 2016-05-23: 30 mL via ORAL
  Filled 2016-05-22: qty 30

## 2016-05-22 MED ORDER — BISACODYL 10 MG RE SUPP
10.0000 mg | Freq: Every day | RECTAL | Status: DC | PRN
  Administered 2016-05-24: 10 mg via RECTAL
  Filled 2016-05-22: qty 1

## 2016-05-22 MED ORDER — MIDAZOLAM HCL 5 MG/5ML IJ SOLN
INTRAMUSCULAR | Status: DC | PRN
  Administered 2016-05-22: 1 mg via INTRAVENOUS

## 2016-05-22 MED ORDER — GLYCOPYRROLATE 0.2 MG/ML IJ SOLN
INTRAMUSCULAR | Status: DC | PRN
  Administered 2016-05-22: 0.2 mg via INTRAVENOUS

## 2016-05-22 MED ORDER — MAGNESIUM CITRATE PO SOLN
1.0000 | Freq: Once | ORAL | Status: DC | PRN
  Filled 2016-05-22: qty 296

## 2016-05-22 MED ORDER — ONDANSETRON HCL 4 MG/2ML IJ SOLN: 4.0000 mg | Freq: Four times a day (QID) | INTRAMUSCULAR | Status: DC | PRN

## 2016-05-22 MED ORDER — DOCUSATE SODIUM 100 MG PO CAPS
100.0000 mg | ORAL_CAPSULE | Freq: Two times a day (BID) | ORAL | Status: DC
  Administered 2016-05-22 – 2016-05-24 (×4): 100 mg via ORAL
  Filled 2016-05-22 (×4): qty 1

## 2016-05-22 MED ORDER — METHOCARBAMOL 500 MG PO TABS
500.0000 mg | ORAL_TABLET | Freq: Four times a day (QID) | ORAL | Status: DC | PRN
  Administered 2016-05-22 – 2016-05-23 (×2): 500 mg via ORAL
  Filled 2016-05-22 (×2): qty 1

## 2016-05-22 MED ORDER — ZOLPIDEM TARTRATE 5 MG PO TABS: 5.0000 mg | ORAL_TABLET | Freq: Every evening | ORAL | Status: DC | PRN

## 2016-05-22 MED ORDER — PROPOFOL 500 MG/50ML IV EMUL
INTRAVENOUS | Status: DC | PRN
  Administered 2016-05-22: 75 ug/kg/min via INTRAVENOUS

## 2016-05-22 MED ORDER — NEOMYCIN-POLYMYXIN B GU 40-200000 IR SOLN
Status: DC | PRN
  Administered 2016-05-22: 16 mL

## 2016-05-22 MED ORDER — PHENOL 1.4 % MT LIQD
1.0000 | OROMUCOSAL | Status: DC | PRN
  Filled 2016-05-22: qty 177

## 2016-05-22 MED ORDER — OXYCODONE HCL 5 MG PO TABS
5.0000 mg | ORAL_TABLET | ORAL | Status: DC | PRN
  Administered 2016-05-22: 10 mg via ORAL
  Administered 2016-05-22: 5 mg via ORAL
  Administered 2016-05-22 – 2016-05-23 (×2): 10 mg via ORAL
  Administered 2016-05-23: 5 mg via ORAL
  Administered 2016-05-23 – 2016-05-24 (×5): 10 mg via ORAL
  Filled 2016-05-22 (×9): qty 2
  Filled 2016-05-22: qty 1

## 2016-05-22 MED ORDER — ENOXAPARIN SODIUM 30 MG/0.3ML ~~LOC~~ SOLN
30.0000 mg | Freq: Two times a day (BID) | SUBCUTANEOUS | Status: DC
Start: 2016-05-23 — End: 2016-05-24
  Administered 2016-05-23 – 2016-05-24 (×3): 30 mg via SUBCUTANEOUS
  Filled 2016-05-22 (×3): qty 0.3

## 2016-05-22 MED ORDER — FENTANYL CITRATE (PF) 100 MCG/2ML IJ SOLN
INTRAMUSCULAR | Status: AC
  Administered 2016-05-22: 25 ug via INTRAVENOUS
  Filled 2016-05-22: qty 2

## 2016-05-22 MED ORDER — CEFAZOLIN SODIUM-DEXTROSE 2-4 GM/100ML-% IV SOLN
2.0000 g | Freq: Once | INTRAVENOUS | Status: AC
  Administered 2016-05-22: 2 g via INTRAVENOUS

## 2016-05-22 MED ORDER — ACETAMINOPHEN 325 MG PO TABS: 650.0000 mg | ORAL_TABLET | Freq: Four times a day (QID) | ORAL | Status: DC | PRN

## 2016-05-22 MED ORDER — FLUTICASONE PROPIONATE 50 MCG/ACT NA SUSP
1.0000 | Freq: Every day | NASAL | Status: DC
  Administered 2016-05-23 – 2016-05-24 (×2): 1 via NASAL
  Filled 2016-05-22: qty 16

## 2016-05-22 MED ORDER — FENTANYL CITRATE (PF) 100 MCG/2ML IJ SOLN
INTRAMUSCULAR | Status: DC | PRN
  Administered 2016-05-22 (×4): 50 ug via INTRAVENOUS

## 2016-05-22 MED ORDER — KETOROLAC TROMETHAMINE 30 MG/ML IJ SOLN
INTRAMUSCULAR | Status: DC | PRN
  Administered 2016-05-22: 30 mg via INTRA_ARTICULAR

## 2016-05-22 MED ORDER — FAMOTIDINE 20 MG PO TABS
ORAL_TABLET | ORAL | Status: AC
  Administered 2016-05-22: 20 mg via ORAL
  Filled 2016-05-22: qty 1

## 2016-05-22 MED ORDER — METOCLOPRAMIDE HCL 5 MG/ML IJ SOLN: 5.0000 mg | Freq: Three times a day (TID) | INTRAMUSCULAR | Status: DC | PRN

## 2016-05-22 MED ORDER — MENTHOL 3 MG MT LOZG
1.0000 | LOZENGE | OROMUCOSAL | Status: DC | PRN
  Filled 2016-05-22: qty 9

## 2016-05-22 MED ORDER — SODIUM CHLORIDE 0.9 % IV SOLN
INTRAVENOUS | Status: DC | PRN
  Administered 2016-05-22: 60 mL

## 2016-05-22 MED ORDER — BUPIVACAINE-EPINEPHRINE (PF) 0.25% -1:200000 IJ SOLN
INTRAMUSCULAR | Status: AC
  Filled 2016-05-22: qty 30

## 2016-05-22 MED ORDER — NEOMYCIN-POLYMYXIN B GU 40-200000 IR SOLN
Status: AC
  Filled 2016-05-22: qty 20

## 2016-05-22 MED ORDER — CEFAZOLIN SODIUM-DEXTROSE 2-4 GM/100ML-% IV SOLN
INTRAVENOUS | Status: AC
  Filled 2016-05-22: qty 100

## 2016-05-22 MED ORDER — MORPHINE SULFATE (PF) 10 MG/ML IV SOLN
INTRAVENOUS | Status: AC
  Filled 2016-05-22: qty 1

## 2016-05-22 MED ORDER — TRANEXAMIC ACID 1000 MG/10ML IV SOLN
INTRAVENOUS | Status: DC | PRN
  Administered 2016-05-22: 1000 mg via INTRAVENOUS

## 2016-05-22 MED ORDER — MORPHINE SULFATE 10 MG/ML IJ SOLN
INTRAMUSCULAR | Status: DC | PRN
  Administered 2016-05-22: 10 mg

## 2016-05-22 MED ORDER — BUPIVACAINE IN DEXTROSE 0.75-8.25 % IT SOLN
INTRATHECAL | Status: DC | PRN
  Administered 2016-05-22: 1.6 mL via INTRATHECAL

## 2016-05-22 MED ORDER — NICOTINE 14 MG/24HR TD PT24
14.0000 mg | MEDICATED_PATCH | Freq: Every day | TRANSDERMAL | Status: DC
  Administered 2016-05-22 – 2016-05-24 (×3): 14 mg via TRANSDERMAL
  Filled 2016-05-22 (×3): qty 1

## 2016-05-22 MED ORDER — SODIUM CHLORIDE 0.9 % IV SOLN
INTRAVENOUS | Status: DC
  Administered 2016-05-22 (×2): via INTRAVENOUS

## 2016-05-22 MED ORDER — FENTANYL CITRATE (PF) 100 MCG/2ML IJ SOLN
INTRAMUSCULAR | Status: AC
  Filled 2016-05-22: qty 2

## 2016-05-22 MED ORDER — MORPHINE SULFATE (PF) 2 MG/ML IV SOLN
2.0000 mg | INTRAVENOUS | Status: DC | PRN
  Administered 2016-05-22 (×4): 2 mg via INTRAVENOUS
  Filled 2016-05-22 (×4): qty 1

## 2016-05-22 MED ORDER — METHOCARBAMOL 1000 MG/10ML IJ SOLN
500.0000 mg | Freq: Four times a day (QID) | INTRAVENOUS | Status: DC | PRN
  Filled 2016-05-22: qty 5

## 2016-05-22 MED ORDER — METOCLOPRAMIDE HCL 10 MG PO TABS
5.0000 mg | ORAL_TABLET | Freq: Three times a day (TID) | ORAL | Status: DC | PRN
  Administered 2016-05-22: 10 mg via ORAL
  Filled 2016-05-22: qty 1

## 2016-05-22 MED ORDER — FAMOTIDINE 20 MG PO TABS
20.0000 mg | ORAL_TABLET | Freq: Once | ORAL | Status: AC
  Administered 2016-05-22: 20 mg via ORAL

## 2016-05-22 MED ORDER — BUPIVACAINE LIPOSOME 1.3 % IJ SUSP
INTRAMUSCULAR | Status: AC
  Filled 2016-05-22: qty 20

## 2016-05-22 MED ORDER — TRANEXAMIC ACID 1000 MG/10ML IV SOLN
INTRAVENOUS | Status: AC
  Filled 2016-05-22: qty 10

## 2016-05-22 MED ORDER — ONDANSETRON HCL 4 MG PO TABS: 4.0000 mg | ORAL_TABLET | Freq: Four times a day (QID) | ORAL | Status: DC | PRN

## 2016-05-22 SURGICAL SUPPLY — 65 items
BANDAGE ACE 6X5 VEL STRL LF (GAUZE/BANDAGES/DRESSINGS) ×3 IMPLANT
BLADE CLIPPER SURG (BLADE) ×3 IMPLANT
BLADE SAW 1 (BLADE) ×3 IMPLANT
BLOCK CUTTING FEMUR 5 RT MED (MISCELLANEOUS) IMPLANT
BLOCK CUTTING TIBIAL 4 RT MIS (MISCELLANEOUS) IMPLANT
BLOCK CUTTING TIBIAL 5 RT (MISCELLANEOUS) ×3 IMPLANT
CANISTER SUCT 1200ML W/VALVE (MISCELLANEOUS) ×3 IMPLANT
CANISTER SUCT 3000ML (MISCELLANEOUS) ×6 IMPLANT
CAPT KNEE TOTAL 3 ×3 IMPLANT
CATH FOL LEG HOLDER (MISCELLANEOUS) ×3 IMPLANT
CATH TRAY METER 16FR LF (MISCELLANEOUS) ×3 IMPLANT
CEMENT HV SMART SET (Cement) ×6 IMPLANT
CHLORAPREP W/TINT 26ML (MISCELLANEOUS) ×3 IMPLANT
COOLER POLAR GLACIER W/PUMP (MISCELLANEOUS) ×3 IMPLANT
CUFF TOURN 24 STER (MISCELLANEOUS) IMPLANT
CUFF TOURN 30 STER DUAL PORT (MISCELLANEOUS) IMPLANT
CUFF TOURN 34 STER (MISCELLANEOUS) ×3 IMPLANT
DRAPE INCISE IOBAN 66X45 STRL (DRAPES) ×6 IMPLANT
DRAPE SHEET LG 3/4 BI-LAMINATE (DRAPES) ×6 IMPLANT
ELECT CAUTERY BLADE 6.4 (BLADE) ×3 IMPLANT
ELECT REM PT RETURN 9FT ADLT (ELECTROSURGICAL) ×3
ELECTRODE REM PT RTRN 9FT ADLT (ELECTROSURGICAL) ×1 IMPLANT
FEMUR BONE MODEL IMPLANT
GAUZE PETRO XEROFOAM 1X8 (MISCELLANEOUS) ×3 IMPLANT
GAUZE SPONGE 4X4 12PLY STRL (GAUZE/BANDAGES/DRESSINGS) ×3 IMPLANT
GLOVE BIOGEL PI IND STRL 9 (GLOVE) ×1 IMPLANT
GLOVE BIOGEL PI INDICATOR 9 (GLOVE) ×2
GLOVE INDICATOR 8.0 STRL GRN (GLOVE) ×6 IMPLANT
GLOVE SURG ORTHO 8.0 STRL STRW (GLOVE) ×6 IMPLANT
GLOVE SURG ORTHO 9.0 STRL STRW (GLOVE) ×3 IMPLANT
GOWN SRG 2XL LVL 4 RGLN SLV (GOWNS) ×1 IMPLANT
GOWN STRL NON-REIN 2XL LVL4 (GOWNS) ×2
GOWN STRL REUS W/ TWL LRG LVL3 (GOWN DISPOSABLE) ×2 IMPLANT
GOWN STRL REUS W/ TWL XL LVL3 (GOWN DISPOSABLE) ×1 IMPLANT
GOWN STRL REUS W/TWL LRG LVL3 (GOWN DISPOSABLE) ×4
GOWN STRL REUS W/TWL XL LVL3 (GOWN DISPOSABLE) ×2
HANDPIECE INTERPULSE COAX TIP (DISPOSABLE) ×2
HOOD PEEL AWAY FLYTE STAYCOOL (MISCELLANEOUS) ×6 IMPLANT
IMMBOLIZER KNEE 19 BLUE UNIV (SOFTGOODS) ×3 IMPLANT
KIT RM TURNOVER STRD PROC AR (KITS) ×3 IMPLANT
KNEE MEDACTA TIBIAL/FEMORAL BL (Knees) ×3 IMPLANT
KNIFE SCULPS 14X20 (INSTRUMENTS) ×3 IMPLANT
NDL SAFETY 18GX1.5 (NEEDLE) ×3 IMPLANT
NEEDLE FILTER BLUNT 18X 1/2SAF (NEEDLE) ×2
NEEDLE FILTER BLUNT 18X1 1/2 (NEEDLE) ×1 IMPLANT
NEEDLE SPNL 18GX3.5 QUINCKE PK (NEEDLE) ×3 IMPLANT
NEEDLE SPNL 20GX3.5 QUINCKE YW (NEEDLE) ×3 IMPLANT
NS IRRIG 1000ML POUR BTL (IV SOLUTION) ×3 IMPLANT
PACK TOTAL KNEE (MISCELLANEOUS) ×3 IMPLANT
PAD WRAPON POLAR KNEE (MISCELLANEOUS) ×1 IMPLANT
SET HNDPC FAN SPRY TIP SCT (DISPOSABLE) ×1 IMPLANT
SOL .9 NS 3000ML IRR  AL (IV SOLUTION) ×2
SOL .9 NS 3000ML IRR UROMATIC (IV SOLUTION) ×1 IMPLANT
STAPLER SKIN PROX 35W (STAPLE) ×3 IMPLANT
SUCTION FRAZIER HANDLE 10FR (MISCELLANEOUS) ×2
SUCTION TUBE FRAZIER 10FR DISP (MISCELLANEOUS) ×1 IMPLANT
SUT DVC 2 QUILL PDO  T11 36X36 (SUTURE) ×2
SUT DVC 2 QUILL PDO T11 36X36 (SUTURE) ×1 IMPLANT
SUT DVC QUILL MONODERM 30X30 (SUTURE) ×3 IMPLANT
SYR 20CC LL (SYRINGE) ×3 IMPLANT
SYR 50ML LL SCALE MARK (SYRINGE) ×6 IMPLANT
SYRINGE 10CC LL (SYRINGE) ×3 IMPLANT
TOWEL OR 17X26 4PK STRL BLUE (TOWEL DISPOSABLE) ×3 IMPLANT
TOWER CARTRIDGE SMART MIX (DISPOSABLE) ×3 IMPLANT
WRAPON POLAR PAD KNEE (MISCELLANEOUS) ×3

## 2016-05-22 NOTE — Anesthesia Procedure Notes (Signed)
Anesthesia Regional Block: Narrative:       

## 2016-05-22 NOTE — Anesthesia Procedure Notes (Signed)
Anesthesia Regional Block:  Interscalene brachial plexus block  Pre-Anesthetic Checklist: ,, timeout performed, Correct Patient, Correct Site, Correct Laterality, Correct Procedure, Correct Position, site marked, Risks and benefits discussed,  Surgical consent,  Pre-op evaluation,  At surgeon's request and post-op pain management   Prep: Betadine       Needles:  Injection technique: Single-shot  Needle Type: Echogenic Stimulator Needle     Needle Length: 5cm 5 cm Needle Gauge: 21 and 21 G    Additional Needles:  Procedures: ultrasound guided (picture in chart) and nerve stimulator Interscalene brachial plexus block  Nerve Stimulator or Paresthesia:  Response: biceps flexion, 0.8 mA,   Additional Responses:   Narrative:  Injection made incrementally with aspirations every 5 mL.  Performed by: Personally  Anesthesiologist: Gunnar Bulla  Additional Notes: Functioning IV was confirmed and monitors were applied.  A 40mm 22ga Arrow echogenic stimulator needle was used. Sterile prep and drape,hand hygiene and sterile gloves were used.  Negative aspiration and negative test dose prior to incremental administration of local anesthetic. The patient tolerated the procedure well.  Ultrasound guidance: relevent anatomy identified, needle position confirmed, local anesthetic spread visualized around nerve(s), vascular puncture avoided.  Image printed for medical record. 0724 marcaine 12.5mg .

## 2016-05-22 NOTE — Anesthesia Preprocedure Evaluation (Signed)
Anesthesia Evaluation  Patient identified by MRN, date of birth, ID band Patient awake    Reviewed: Allergy & Precautions, NPO status , Patient's Chart, lab work & pertinent test results, reviewed documented beta blocker date and time   Airway Mallampati: III  TM Distance: >3 FB     Dental  (+) Chipped   Pulmonary sleep apnea , Current Smoker,           Cardiovascular hypertension, Pt. on medications and Pt. on home beta blockers      Neuro/Psych    GI/Hepatic   Endo/Other    Renal/GU      Musculoskeletal  (+) Arthritis ,   Abdominal   Peds  Hematology   Anesthesia Other Findings   Reproductive/Obstetrics                             Anesthesia Physical Anesthesia Plan  ASA: III  Anesthesia Plan: Spinal   Post-op Pain Management:    Induction: Intravenous  Airway Management Planned:   Additional Equipment:   Intra-op Plan:   Post-operative Plan:   Informed Consent: I have reviewed the patients History and Physical, chart, labs and discussed the procedure including the risks, benefits and alternatives for the proposed anesthesia with the patient or authorized representative who has indicated his/her understanding and acceptance.     Plan Discussed with: CRNA  Anesthesia Plan Comments:         Anesthesia Quick Evaluation

## 2016-05-22 NOTE — H&P (Signed)
Reviewed paper H+P, will be scanned into chart. No changes noted.  

## 2016-05-22 NOTE — Progress Notes (Signed)
Dr Rudene Christians on rounds. Pt with cloudy urine with sediment. md reports pt on antibiotics iv. No chg

## 2016-05-22 NOTE — Care Management Note (Signed)
Case Management Note  Patient Details  Name: Daniel Boyer. MRN: [id number] Date of Birth: Aug 31, 1946  Subjective/Objective:    Spoke with patient and spouse. Patient is alert and oriented from home. Spouse is able to assist patient at home. B=Patient has Archivist, Does not feel he needs BSC. Patient would like [facility] ( has had them prior and would like to continue. )  Pharmacy is [facility] in [place]. Awaiting PT assessment.               Action/Plan:  Anticipated discharge plan is home with [facility].  Awaiting PT evaluation.    Expected Discharge Date:                  Expected Discharge Plan:  [place]  In-House Referral:     Discharge planning Services  CM Consult   Post Acute Care Choice:  Durable Medical Equipment, Home Health Choice offered to:  Patient  DME Arranged:    DME Agency:  [place]:  PT [facility] Agency:  [place]  Status of Service:  In process, will continue to follow  If discussed at Long Length of Stay Meetings, dates discussed:    Additional Comments:  Alvie Heidelberg, RN 05/22/2016, 3:10 PM

## 2016-05-22 NOTE — Evaluation (Signed)
Physical Therapy Evaluation Patient Details Name: Daniel Boyer. MRN: [id number] DOB: Nov 05, 1945 Today's Date: 05/22/2016   History of Present Illness  Pt underwent R TKA s/p primary OA of R knee. Med Hx includes smoking and HTN with beta blockers.   Clinical Impression  Daniel Boyer is a pleasant and cooperative 70 y/o male. He is min assist with bed mobility, mostly to guide his R LE; he is min assist with sit to stand with good cuing for hand and foot placement; he is min assist with gait. Given the pt's large size he benefits from detailed cuing for hand, foot, and trunk placement for optimal performance with functional mobility. Pt's SpO2 was 90% sitting at EOB after moving from supine. He reports Px at 6/10 with no dizziness, lightheadedness, or numbness. Pt is very antalgic, as expected; he does not buckle and uses RW appropriately. Pt prefers to stand on the ball of his foot on R to avoid terminal extension, correctable with cuing. Pt is appropriate for skilled PT at this time to address deficits in strength, balance, coordination, endurance, gait, activity tolerance, and safe use of DME. Recommend HHPT to address deficits.    Follow Up Recommendations Home health PT    Equipment Recommendations  Other (comment) (bariatric BSC)    Recommendations for Other Services       Precautions / Restrictions Precautions Precautions: Fall Precaution Comments: Up with RW and +2 close guard  Restrictions Weight Bearing Restrictions: Yes RLE Weight Bearing: Weight bearing as tolerated      Mobility  Bed Mobility Overal bed mobility: Needs Assistance Bed Mobility: Supine to Sit     Supine to sit: Min assist     General bed mobility comments: Pt requires min verbal cues and min assist with getting R LE off EOB  Transfers Overall transfer level: Needs assistance Equipment used: Rolling walker (2 wheeled) Transfers: Sit to/from Stand Sit to Stand: Min assist         General  transfer comment: Pt can move under his own power, but requires cuing for hand and foot placement, and postural preperation  Ambulation/Gait Ambulation/Gait assistance: Min assist Ambulation Distance (Feet): 4 Feet Assistive device: Rolling walker (2 wheeled) Gait Pattern/deviations: Step-to pattern;Antalgic   Gait velocity interpretation: <1.8 ft/sec, indicative of risk for recurrent falls General Gait Details: Pt is very antalgic, as expected; he does not buckle and uses RW appropriately. Pt prefers to stand on the ball of his foot on R to avoud terminal extension  Stairs            Wheelchair Mobility    Modified Rankin (Stroke Patients Only)       Balance Overall balance assessment: Needs assistance Sitting-balance support: No upper extremity supported;Feet supported Sitting balance-Leahy Scale: Normal Sitting balance - Comments: pt has good postural control   Standing balance support: Bilateral upper extremity supported Standing balance-Leahy Scale: Fair Standing balance comment: Pt is stable in standing, although he prefers to stand on the ball of his foot on R to avoud terminal extension                             Pertinent Vitals/Pain Pain Assessment: 0-10 Pain Score: 6  Pain Location: operative site Pain Descriptors / Indicators: Discomfort;Grimacing;Guarding;Sharp Pain Intervention(s): Limited activity within patient's tolerance;Monitored during session;Premedicated before session;Ice applied;Utilized relaxation techniques;Patient requesting pain meds-RN notified    Home Living Family/patient expects to be discharged to:: Private residence Living  Arrangements: Spouse/significant other Available Help at Discharge: Family Type of Home: House Home Access: Stairs to enter Entrance Stairs-Rails: None Entrance Stairs-Number of Steps: 2 Home Layout: Able to live on main level with bedroom/bathroom Home Equipment: Walker - 2 wheels;Crutches;Cane -  single point      Prior Function Level of Independence: Independent;Independent with assistive device(s)         Comments: Pt uses SPC and crutches as needed     Hand Dominance        Extremity/Trunk Assessment   Upper Extremity Assessment: Overall WFL for tasks assessed           Lower Extremity Assessment: RLE deficits/detail (L LE WFL) RLE Deficits / Details: Pt is able to do 10 SLRs with min assist, he does not buckle with standing    Cervical / Trunk Assessment: Normal  Communication   Communication: No difficulties  Cognition Arousal/Alertness: Awake/alert Behavior During Therapy: WFL for tasks assessed/performed Overall Cognitive Status: Within Functional Limits for tasks assessed                      General Comments      Exercises Total Joint Exercises Ankle Circles/Pumps: 20 reps;Both;AROM;Supine (with verbal cuing) Quad Sets: 15 reps;Both;AROM;Supine (with tactile/verbal cuing) Heel Slides: 5 reps;Right;AAROM;Seated (with min assist, tactile/verbal cuing) Hip ABduction/ADduction: 15 reps;Right;AAROM;Seated (with min assist, tactile/verbal cuing) Straight Leg Raises: 10 reps;Right;AAROM;Supine (with min-mod assist tactile/verbal cuing) Goniometric ROM: R knee AAROM 4-86 degrees      Assessment/Plan    PT Assessment Patient needs continued PT services  PT Diagnosis Difficulty walking;Abnormality of gait;Generalized weakness;Acute pain   PT Problem List Decreased strength;Decreased range of motion;Decreased activity tolerance;Decreased balance;Decreased coordination;Decreased mobility;Decreased knowledge of use of DME;Decreased safety awareness;Decreased knowledge of precautions;Pain  PT Treatment Interventions DME instruction;Gait training;Stair training;Functional mobility training;Therapeutic activities;Therapeutic exercise;Balance training;Neuromuscular re-education;Patient/family education   PT Goals (Current goals can be found in the  Care Plan section) Acute Rehab PT Goals Patient Stated Goal: go home PT Goal Formulation: With patient Time For Goal Achievement: 06/05/16 Potential to Achieve Goals: Good    Frequency BID   Barriers to discharge        Co-evaluation               End of Session Equipment Utilized During Treatment: Gait belt Activity Tolerance: Patient tolerated treatment well Patient left: in chair;with call bell/phone within reach;with chair alarm set;with SCD's reapplied Nurse Communication: Mobility status         Time: [id number] PT Time Calculation (min) (ACUTE ONLY): 25 min   Charges:         PT G Codes:        Sahithi Ordoyne 06/13/2016, 5:56 PM  Burnett Corrente, SPT [phone]

## 2016-05-22 NOTE — Transfer of Care (Signed)
Immediate Anesthesia Transfer of Care Note  Patient: Daniel Boyer.  Procedure(s) Performed: Procedure(s): TOTAL KNEE ARTHROPLASTY (Right)  Patient Location: PACU  Anesthesia Type:Spinal  Level of Consciousness: awake and responds to stimulation  Airway & Oxygen Therapy: Patient Spontanous Breathing and Patient connected to face mask oxygen  Post-op Assessment: Report given to RN and Post -op Vital signs reviewed and stable  Post vital signs: Reviewed and stable  Last Vitals:  Vitals:   05/22/16 0602 05/22/16 0954  BP: (!) 111/53 135/76  Pulse: 66 (!) 59  Resp: 20 12  Temp: 36.2 C 36.3 C    Last Pain:  Vitals:   05/22/16 0602  TempSrc: Tympanic  PainSc: 6          Complications: No apparent anesthesia complications

## 2016-05-22 NOTE — Anesthesia Procedure Notes (Signed)
Spinal  Patient location during procedure: OR Staffing Anesthesiologist: Gunter Conde Performed: anesthesiologist  Preanesthetic Checklist Completed: patient identified, site marked, surgical consent, pre-op evaluation, timeout performed, IV checked and risks and benefits discussed Spinal Block Patient position: sitting Prep: Betadine Patient monitoring: heart rate, cardiac monitor, continuous pulse ox and blood pressure Approach: midline Location: L3-4 Injection technique: single-shot Needle Needle type: Pencil-Tip  Needle gauge: 25 G Needle length: 9 cm Assessment Sensory level: T10     

## 2016-05-22 NOTE — Anesthesia Procedure Notes (Signed)
Performed by: Lance Muss Pre-anesthesia Checklist: Patient identified, Emergency Drugs available, Suction available, Patient being monitored and Timeout performed Patient Re-evaluated:Patient Re-evaluated prior to inductionOxygen Delivery Method: Simple face mask Intubation method: #7 nasal airway.

## 2016-05-22 NOTE — NC FL2 (Signed)
[place] LEVEL OF CARE SCREENING TOOL     IDENTIFICATION  Patient Name: Daniel Boyer. Birthdate: 1946/09/17 Sex: male Admission Date (Current Location): 05/22/2016  [place] and [place] Number:  Engineering geologist and Address:  [facility], [address] [place], [place] [address]      Provider Number: [id number]  Attending Physician Name and Address:  Hessie Knows, MD  Relative Name and Phone Number:       Current Level of Care: Hospital Recommended Level of Care: [place] Prior Approval Number:    Date Approved/Denied:   PASRR Number:  ([id number])  Discharge Plan: SNF    Current Diagnoses: Patient Active Problem List   Diagnosis Date Noted  . Primary localized osteoarthritis of right knee 05/22/2016   Problem Noted Date  Morbid obesity with BMI of 45.0-49.9 ([place]) 04/05/2016  Hyperglycemia 09/29/2015  Benign essential hypertension 07/06/2015  Atrial flutter, paroxysmal , unspecified (CMS-HCC) 01/05/2015  OSA (obstructive sleep apnea) 01/05/2015  Obesity   Non-cardiac chest pain   Mixed hyperlipidemia   COPD (chronic obstructive pulmonary disease) (CMS-HCC)   Tobacco abuse      Orientation RESPIRATION BLADDER Height & Weight     Self, Time, Situation, Place  O2 (2 Liters Oxygen ) Continent Weight: (!) 316 lb 8 oz (143.6 kg) Height:  6' (182.9 cm)  BEHAVIORAL SYMPTOMS/MOOD NEUROLOGICAL BOWEL NUTRITION STATUS   (none )  (none ) Continent Diet (Diet: Clear Liquid )  AMBULATORY STATUS COMMUNICATION OF NEEDS Skin   Extensive Assist Verbally Surgical wounds                       Personal Care Assistance Level of Assistance  Bathing, Feeding, Dressing Bathing Assistance: Limited assistance Feeding assistance: Independent Dressing Assistance: Limited assistance     Functional Limitations Info  Sight, Hearing, Speech Sight Info: Adequate Hearing Info: Adequate Speech Info: Adequate     SPECIAL CARE FACTORS FREQUENCY  PT (By licensed PT), OT (By licensed OT)     PT Frequency:  (5) OT Frequency:  (5)            Contractures      Additional Factors Info  Code Status, Allergies Code Status Info:  (Full Code. ) Allergies Info:  (No Known Allergies. )           Current Medications (05/22/2016):  This is the current hospital active medication list Current Facility-Administered Medications  Medication Dose Route Frequency Provider Last Rate Last Dose  . 0.9 %  sodium chloride infusion   Intravenous Continuous Hessie Knows, MD 75 mL/hr at 05/22/16 1158    . acetaminophen (TYLENOL) tablet 650 mg  650 mg Oral Q6H PRN Hessie Knows, MD       Or  . acetaminophen (TYLENOL) suppository 650 mg  650 mg Rectal Q6H PRN Hessie Knows, MD      . aspirin chewable tablet 81 mg  81 mg Oral Daily Hessie Knows, MD   81 mg at 05/22/16 1155  . bisacodyl (DULCOLAX) suppository 10 mg  10 mg Rectal Daily PRN Hessie Knows, MD      . ceFAZolin (ANCEF) 2-4 GM/100ML-% IVPB           . ceFAZolin (ANCEF) 3 g in dextrose 5 % 50 mL IVPB  3 g Intravenous Q6H Hessie Knows, MD   3 g at 05/22/16 1301  . docusate sodium (COLACE) capsule 100 mg  100 mg Oral BID Hessie Knows,  MD      . Derrill Memo ON 05/23/2016] enoxaparin (LOVENOX) injection 30 mg  30 mg Subcutaneous Q12H Hessie Knows, MD      . fentaNYL (SUBLIMAZE) 100 MCG/2ML injection           . fluticasone (FLONASE) 50 MCG/ACT nasal spray 1 spray  1 spray Each Nare Daily Hessie Knows, MD      . magnesium citrate solution 1 Bottle  1 Bottle Oral Once PRN Hessie Knows, MD      . magnesium hydroxide (MILK OF MAGNESIA) suspension 30 mL  30 mL Oral Daily PRN Hessie Knows, MD      . menthol-cetylpyridinium (CEPACOL) lozenge 3 mg  1 lozenge Oral PRN Hessie Knows, MD       Or  . phenol (CHLORASEPTIC) mouth spray 1 spray  1 spray Mouth/Throat PRN Hessie Knows, MD      . methocarbamol (ROBAXIN) tablet 500 mg  500 mg Oral Q6H PRN Hessie Knows, MD   500 mg at  05/22/16 1155   Or  . methocarbamol (ROBAXIN) 500 mg in dextrose 5 % 50 mL IVPB  500 mg Intravenous Q6H PRN Hessie Knows, MD      . metoCLOPramide (REGLAN) tablet 5-10 mg  5-10 mg Oral Q8H PRN Hessie Knows, MD   10 mg at 05/22/16 1155   Or  . metoCLOPramide (REGLAN) injection 5-10 mg  5-10 mg Intravenous Q8H PRN Hessie Knows, MD      . Derrill Memo ON 05/23/2016] metoprolol succinate (TOPROL-XL) 24 hr tablet 100 mg  100 mg Oral Daily Hessie Knows, MD      . morphine 2 MG/ML injection 2 mg  2 mg Intravenous Q1H PRN Hessie Knows, MD   2 mg at 05/22/16 1155  . ondansetron (ZOFRAN) tablet 4 mg  4 mg Oral Q6H PRN Hessie Knows, MD       Or  . ondansetron [facility]) injection 4 mg  4 mg Intravenous Q6H PRN Hessie Knows, MD      . oxyCODONE (Oxy IR/ROXICODONE) immediate release tablet 5-10 mg  5-10 mg Oral Q3H PRN Hessie Knows, MD   5 mg at 05/22/16 1300  . pravastatin (PRAVACHOL) tablet 40 mg  40 mg Oral QHS Hessie Knows, MD      . zolpidem [facility]) tablet 5 mg  5 mg Oral QHS PRN Hessie Knows, MD         Discharge Medications: Please see discharge summary for a list of discharge medications.  Relevant Imaging Results:  Relevant Lab Results:   Additional Information  (SSN: [id number])  Alauna Hayden, Veronia Beets, LCSW

## 2016-05-22 NOTE — Op Note (Signed)
05/22/2016  9:55 AM  PATIENT:  Daniel Boyer.  70 y.o. male  PRE-OPERATIVE DIAGNOSIS:  primary osteoarthritis right knee  POST-OPERATIVE DIAGNOSIS:  primary osteoarthritis right knee  PROCEDURE:  Procedure(s): TOTAL KNEE ARTHROPLASTY (Right)  SURGEON: Laurene Footman, MD  ASSISTANTS: Rachelle Hora [facility]  ANESTHESIA:   spinal  EBL:  Total I/O In: 1000 [I.V.:1000] Out: 275 [Urine:175; Blood:100]  BLOOD ADMINISTERED:none  DRAINS: none   LOCAL MEDICATIONS USED:  MARCAINE    and OTHER morphine Toradol and exparel  SPECIMEN:  No Specimen  DISPOSITION OF SPECIMEN:  N/A  COUNTS:  YES  TOURNIQUET:   93 minutes at 300 mmHg  IMPLANTS: Medacta GMK sphere 5+ femur, 5 tibia with 11 mm insert and 3 patella, all components cemented  DICTATION: .Dragon Dictation patient was brought the operating room and after adequate anesthesia was obtained the right leg was prepped draped in sterile fashion was turned by the upper thigh. After patient identification and timeout procedures were completed tourniquet was raised. Midline skin incision was made followed by medial parapatellar arthrotomy. Inspection revealed eburnated bone in the medial compartment and advanced patellofemoral degenerative changes with moderate lateral compartment degenerative changes. Anterior cruciate ligament PCL and fat pad were excised. The proximal tibia is exposed and the proximal tibia cut carried out subsequent recut to get through the sclerotic area posterior medially where there is extensive wear. The menisci were excised. The distal femoral cutting guide was applied anterior per posterior and chamfer cuts made with 5+ cutting guide and this also had additional 2 mm cut to get a 10 mm spacer gauge and an extension and flexion. After these cuts have been completed and there is adequate resection of the bone to allow for implantation trials were placed with an 11 mm to giving excellent stability the tibia was prepared with  acute proximal drilling and keel punch distal femoral preparation: 2 drill holes for the implant as well as the trochlear groove cut for the deep and trochlear groove on the implant. At after the cement completed of all trials were removed and the patella was cut using the patellar cutting guide after measuring it and drilling 3 drill holes made and it sized to size 3. The tourniquet was let down momentarily to assess bleeding and there is no significant bleeding. The tourniquet was raised again and the bony surfaces thoroughly irrigated and dried. Tibial component was impacted into place first followed by the femoral component with the tibial insert placed with set screw tightened with the torque screwdriver. The patellar button was clamped into place with all components cemented. Excess cemented been removed. After the cement set the knee was thoroughly irrigated the knee was very stable with good patellofemoral tracking with the capsule was repaired using a heavy Quill follow-up with then injection of TXA into the joint, followed by 2-0 Quill and skin staples. Xeroform 4 x 4 Polar Care ABDs and Ace wrap then applied  PLAN OF CARE: Admit to inpatient   PATIENT DISPOSITION:  PACU - hemodynamically stable.

## 2016-05-23 ENCOUNTER — Encounter: Payer: Self-pay | Admitting: Orthopedic Surgery

## 2016-05-23 LAB — CBC
HEMATOCRIT: 39 % — AB (ref 40.0–52.0)
HEMOGLOBIN: 13.7 g/dL (ref 13.0–18.0)
MCH: 29.4 pg (ref 26.0–34.0)
MCHC: 35.2 g/dL (ref 32.0–36.0)
MCV: 83.4 fL (ref 80.0–100.0)
Platelets: 166 10*3/uL (ref 150–440)
RBC: 4.68 MIL/uL (ref 4.40–5.90)
RDW: 13.6 % (ref 11.5–14.5)
WBC: 12 10*3/uL — AB (ref 3.8–10.6)

## 2016-05-23 LAB — BASIC METABOLIC PANEL
ANION GAP: 5 (ref 5–15)
BUN: 14 mg/dL (ref 6–20)
CHLORIDE: 104 mmol/L (ref 101–111)
CO2: 26 mmol/L (ref 22–32)
Calcium: 8 mg/dL — ABNORMAL LOW (ref 8.9–10.3)
Creatinine, Ser: 0.87 mg/dL (ref 0.61–1.24)
GFR calc Af Amer: >60 mL/min (ref >60)
GFR calc non Af Amer: >60 mL/min (ref >60)
GLUCOSE: 167 mg/dL — AB (ref 65–99)
POTASSIUM: 4 mmol/L (ref 3.5–5.1)
Sodium: 135 mmol/L (ref 135–145)

## 2016-05-23 MED ORDER — TRAMADOL HCL 50 MG PO TABS
50.0000 mg | ORAL_TABLET | Freq: Four times a day (QID) | ORAL | Status: DC
  Administered 2016-05-23 – 2016-05-24 (×4): 100 mg via ORAL
  Filled 2016-05-23: qty 2
  Filled 2016-05-23: qty 1
  Filled 2016-05-23 (×3): qty 2

## 2016-05-23 NOTE — Progress Notes (Signed)
Physical Therapy Treatment Patient Details Name: Daniel Boyer. MRN: [id number] DOB: 05/02/1946 Today's Date: 05/23/2016    History of Present Illness Pt. was admitted to [facility] with a right TKR secondary to OA of the right knee.    PT Comments    Pt is progressing rapidly towards goals; he received gait training X 250' with several rest standing rest breaks; gait is improving with less antalgic pattern and more weight bearing on R LE. He tends to overthink his gait sequence but responds well to cues. Pt reports he has been doing ther-ex independently; ther-ex handout given and explained to pt with understanding demonstrated and verbalized. Pt reports Px is 5-6/10 NPRS. Pt has a strong tendency to externally rotate at the hip and flex at the knee B in sitting/supine. PT has educated pt to avoid this and use pillows/ towels to prevent it. Pt has been educated on the benefits of OP PT to address genu varus and impaired movement strategy after finishing rehab for his R TKA. Pt is appropriate for continued PT at this time to address deficits in strength, balance, coordination, ROM, pain, endurance, gait, activity tolerance, safety awareness, and safe use of DME.    Follow Up Recommendations  Home health PT     Equipment Recommendations  Other (comment) (bariatric BSC)    Recommendations for Other Services       Precautions / Restrictions Precautions Precautions: Fall Precaution Comments: Up with RW and +1 Restrictions Weight Bearing Restrictions: Yes RLE Weight Bearing: Weight bearing as tolerated    Mobility  Bed Mobility Overal bed mobility: Needs Assistance Bed Mobility: Supine to Sit     Supine to sit: Min assist     General bed mobility comments: Pt requires min verbal cues and min assist with getting R LE off EOB, pt is independent with trunk control and use of UEs   Transfers Overall transfer level: Needs assistance Equipment used: Rolling walker (2 wheeled) Transfers:  Sit to/from Stand Sit to Stand: Min assist         General transfer comment: Pt can move under his own power, but requires cuing for hand and foot placement, and postural preperation  Ambulation/Gait Ambulation/Gait assistance: Min guard Ambulation Distance (Feet): 250 Feet Assistive device: Rolling walker (2 wheeled) Gait Pattern/deviations: Step-through pattern;Decreased step length - left;Decreased stance time - right;Antalgic   Gait velocity interpretation: <1.8 ft/sec, indicative of risk for recurrent falls General Gait Details: Pt's gait is improving with less antalgic pattern and more weight bearing on R LE. He tends to overthink his gait sequence but responds well to cues.   Stairs            Wheelchair Mobility    Modified Rankin (Stroke Patients Only)       Balance Overall balance assessment: Needs assistance Sitting-balance support: No upper extremity supported;Feet supported Sitting balance-Leahy Scale: Normal Sitting balance - Comments: pt has good postural control   Standing balance support: Bilateral upper extremity supported Standing balance-Leahy Scale: Good Standing balance comment: Standing balance improved, technique with RW improved; can tolerate mild perturbation                    Cognition Arousal/Alertness: Awake/alert Behavior During Therapy: WFL for tasks assessed/performed;Anxious Overall Cognitive Status: Within Functional Limits for tasks assessed                      Exercises Total Joint Exercises Ankle Circles/Pumps: 15 reps;Both;AROM;Seated (with tactile and  verbal cues) Quad Sets: 15 reps;Both;AROM;Supine (with tactile and verbal cues) Short Arc Quad: 15 reps;Right;AAROM;Supine (with min assist, tactile and verbal cues) Hip ABduction/ADduction: 15 reps;Right;AAROM;Seated (with min assist, tactile and verbal cues) Straight Leg Raises: 10 reps;Right;AAROM;Supine (with min assist, tactile and verbal cues) Long Arc  Quad: Other (comment) (attempted)    General Comments        Pertinent Vitals/Pain Pain Assessment: 0-10 Pain Score: 6  Pain Location: opeative site, migating to lateral knee and proximally  Pain Descriptors / Indicators: Discomfort;Grimacing;Guarding;Sharp Pain Intervention(s): Limited activity within patient's tolerance;Monitored during session;Premedicated before session;Utilized relaxation techniques;Ice applied    Home Living Family/patient expects to be discharged to:: Private residence Living Arrangements: Spouse/significant other Available Help at Discharge: Family Type of Home: House Home Access: Stairs to enter Entrance Stairs-Rails: None          Prior Function            PT Goals (current goals can now be found in the care plan section) Acute Rehab PT Goals Patient Stated Goal: To go home PT Goal Formulation: With patient Time For Goal Achievement: 06/05/16 Potential to Achieve Goals: Good Progress towards PT goals: Progressing toward goals    Frequency  BID    PT Plan Current plan remains appropriate    Co-evaluation             End of Session Equipment Utilized During Treatment: Gait belt Activity Tolerance: Patient tolerated treatment well Patient left: with SCD's reapplied;in bed;with call bell/phone within reach;with bed alarm set     Time: [id number] PT Time Calculation (min) (ACUTE ONLY): 25 min  Charges:                       G Codes:      Nioka Thorington 06/19/2016, 4:39 PM  Burnett Corrente, SPT [phone]

## 2016-05-23 NOTE — Anesthesia Postprocedure Evaluation (Signed)
Anesthesia Post Note  Patient: Daniel Boyer.  Procedure(s) Performed: Procedure(s) (LRB): TOTAL KNEE ARTHROPLASTY (Right)  Patient location during evaluation: Nursing Unit Anesthesia Type: Spinal Level of consciousness: awake, awake and alert and oriented Pain management: pain level controlled Vital Signs Assessment: post-procedure vital signs reviewed and stable Respiratory status: spontaneous breathing, nonlabored ventilation and respiratory function stable Cardiovascular status: blood pressure returned to baseline Postop Assessment: no headache and no backache Anesthetic complications: no    Last Vitals:  Vitals:   05/23/16 0028 05/23/16 0352  BP: 124/75 (!) 141/77  Pulse: 69 71  Resp: 18 16  Temp: 36.6 C 36.4 C    Last Pain:  Vitals:   05/23/16 0632  TempSrc:   PainSc: 5                  [employer]

## 2016-05-23 NOTE — Evaluation (Signed)
Occupational Therapy Evaluation Patient Details Name: Daniel Boyer. MRN: [id number] DOB: 02-29-1946 Today's Date: 05/23/2016    History of Present Illness Pt. was admitted to [facility] with a right TKR secondary to OA of the right knee.   Clinical Impression   Pt. Is a 70 y.o. Male who was admitted for a right TKR. Pt presents with limited ROM, 9/10 Pain, weakness, and impaired functional mobility which hinder his ability to complete ADL and IADL tasks. Pt. could benefit from skilled OT services to review A/E use for LE ADLs, to review necessary home modifications, and to improve functional mobility for ADL/IADLs in order to work towards regaining Independence with ADL/IADLs. Pt. Wife reports having the A/E at home.    Follow Up Recommendations  No OT follow up    Equipment Recommendations       Recommendations for Other Services       Precautions / Restrictions Precautions Precautions: Fall Restrictions Weight Bearing Restrictions: Yes RLE Weight Bearing: Weight bearing as tolerated      Mobility                   Transfers    Deferred to PT: Min Assist for                     Balance                                            ADL Overall ADL's : Needs assistance/impaired Eating/Feeding: Set up   Grooming: Set up               Lower Body Dressing: Maximal assistance                 General ADL Comments: Pt. education was provided about A/E for LE dressing with verbal, and visual demonstration.      Vision     Perception     Praxis      Pertinent Vitals/Pain Pain Assessment: 0-10 Pain Score: 9  Pain Location: Right knee Pain Intervention(s): Limited activity within patient's tolerance;Premedicated before session     Hand Dominance Right   Extremity/Trunk Assessment Upper Extremity Assessment Upper Extremity Assessment: Overall WFL for tasks assessed           Communication  Communication Communication: No difficulties   Cognition   Behavior During Therapy: WFL for tasks assessed/performed;Anxious (Secondary to 9/10.) Overall Cognitive Status: Within Functional Limits for tasks assessed                     General Comments       Exercises       Shoulder Instructions      Home Living Family/patient expects to be discharged to:: Private residence Living Arrangements: Spouse/significant other Available Help at Discharge: Family Type of Home: House Home Access: Stairs to enter Technical brewer of Steps: 2 Entrance Stairs-Rails: None Home Layout: Able to live on main level with bedroom/bathroom     Bathroom Shower/Tub: Walk-in shower;Curtain     Bathroom Accessibility: Yes   Home Equipment: Walker - 2 wheels;Crutches;Cane - single point          Prior Functioning/Environment Level of Independence: Independent;Independent with assistive device(s)             OT Diagnosis: Generalized weakness   OT Problem List: Decreased strength;Decreased range of  motion;Decreased activity tolerance;Decreased knowledge of use of DME or AE;Pain   OT Treatment/Interventions: Self-care/ADL training;Therapeutic exercise;DME and/or AE instruction;Patient/family education    OT Goals(Current goals can be found in the care plan section) Acute Rehab OT Goals Patient Stated Goal: To go home OT Goal Formulation: With patient  OT Frequency:     Barriers to D/C:            Co-evaluation              End of Session    Activity Tolerance: Patient tolerated treatment well Patient left: in chair;with call bell/phone within reach;with chair alarm set;with family/visitor present   Time: [id number] OT Time Calculation (min): 15 min Charges:  OT General Charges $OT Visit: 1 Procedure OT Evaluation $OT Eval Low Complexity: 1 Procedure G-Codes:    Harrel Carina, MS, OTR/L 05/23/2016, 11:25 AM

## 2016-05-23 NOTE — Progress Notes (Signed)
Physical Therapy Treatment Patient Details Name: Daniel Boyer. MRN: [id number] DOB: August 15, 1946 Today's Date: 05/23/2016    History of Present Illness Pt. was admitted to [facility] with a right TKR secondary to OA of the right knee.    PT Comments    Pt is progressing towards goals. His Px appears to be under control, with moderate increase with activity and rapid return to baseline with rest to 6/10 NPRS. Pt reports he did not sleep well last night. He is still min assist with bed mobility, transfers, and min assist with ambulation. He requires less cuing overall, but still extensive cuing with ambulation for safe use of RW. Pt is able to impove gait with cuing, but requies reminders due to distraction from Px. Pt is very antalgic, as expected; he does not buckle. He tolerated ther-ex well and ambulated 110' with mod exertion. R knee AAROM 8-78 degrees. Terminal extension limited due to stiffness at beginning of session, it will likely improve functionally. Pt is appropriate for continued PT at this time to address deficits in strength, balance, coordination, ROM, pain, endurance, gait, activity tolerance, safety awareness, and safe use of DME.    Follow Up Recommendations  Home health PT     Equipment Recommendations  Other (comment)    Recommendations for Other Services       Precautions / Restrictions Precautions Precautions: Fall Precaution Comments: Up with RW and +1 Restrictions Weight Bearing Restrictions: Yes RLE Weight Bearing: Weight bearing as tolerated    Mobility  Bed Mobility Overal bed mobility: Needs Assistance Bed Mobility: Supine to Sit     Supine to sit: Min assist     General bed mobility comments: Pt requires min verbal cues and min assist with getting R LE off EOB, pt is independent with trunk control and use of UEs   Transfers Overall transfer level: Needs assistance Equipment used: Rolling walker (2 wheeled) Transfers: Sit to/from Stand Sit to  Stand: Min assist         General transfer comment: Pt can move under his own power, but requires cuing for hand and foot placement, and postural preperation  Ambulation/Gait Ambulation/Gait assistance: Min assist Ambulation Distance (Feet): 110 Feet Assistive device: Rolling walker (2 wheeled) Gait Pattern/deviations: Step-to pattern;Antalgic   Gait velocity interpretation: <1.8 ft/sec, indicative of risk for recurrent falls General Gait Details: Pt is very antalgic, as expected; he does not buckle. Pt requires repeated cuing fo safe use of RW. Pt is able to impove gait with cuing, but requies reminders due to distraction from Px.   Stairs            Wheelchair Mobility    Modified Rankin (Stroke Patients Only)       Balance Overall balance assessment: Needs assistance Sitting-balance support: No upper extremity supported;Feet supported Sitting balance-Leahy Scale: Normal Sitting balance - Comments: pt has good postural control   Standing balance support: Bilateral upper extremity supported Standing balance-Leahy Scale: Good Standing balance comment: Good standing balance with RW, requires cuing to avoid standing too far forward in RW                    Cognition Arousal/Alertness: Awake/alert Behavior During Therapy: [facility] for tasks assessed/performed;Anxious Overall Cognitive Status: Within Functional Limits for tasks assessed                      Exercises Total Joint Exercises Ankle Circles/Pumps: Other reps (comment);Both;AROM;Seated (30, with tactile and verbal cues)  Quad Sets: 15 reps;Both;AROM;Supine (with tactile and verbal cues) Heel Slides: 5 reps;Right;AAROM;Standing (with min assist, tactile and verbal cues) Hip ABduction/ADduction: 15 reps;Right;AAROM;Seated (with min assist, tactile and verbal cues)    General Comments        Pertinent Vitals/Pain Pain Assessment: 0-10 Pain Score: 6  Pain Location: opeative site, migating to  lateral knee and proximally  Pain Descriptors / Indicators: Discomfort;Grimacing;Guarding;Sharp Pain Intervention(s): Limited activity within patient's tolerance;Monitored during session;Premedicated before session;Utilized relaxation techniques;Ice applied    Home Living Family/patient expects to be discharged to:: Private residence Living Arrangements: Spouse/significant other Available Help at Discharge: Family Type of Home: House Home Access: Stairs to enter Entrance Stairs-Rails: None Home Layout: Able to live on main level with bedroom/bathroom Home Equipment: Walker - 2 wheels;Crutches;Cane - single point      Prior Function Level of Independence: Independent;Independent with assistive device(s)      Comments: Pt uses SPC and crutches as needed for knee Px   PT Goals (current goals can now be found in the care plan section) Acute Rehab PT Goals Patient Stated Goal: To go home PT Goal Formulation: With patient Time For Goal Achievement: 06/05/16 Potential to Achieve Goals: Good Progress towards PT goals: Progressing toward goals    Frequency  BID    PT Plan Current plan remains appropriate    Co-evaluation             End of Session Equipment Utilized During Treatment: Gait belt Activity Tolerance: Patient tolerated treatment well Patient left: in chair;with call bell/phone within reach;with chair alarm set;with family/visitor present;with SCD's reapplied     Time: [id number] PT Time Calculation (min) (ACUTE ONLY): 28 min  Charges:                       G Codes:      Kauan Kloosterman Jun 12, 2016, 11:53 AM  Burnett Corrente, SPT [phone]

## 2016-05-23 NOTE — Progress Notes (Signed)
Clinical Education officer, museum (CSW) received SNF consult. PT is recommending home health. RN Case Manager aware of above. Please reconsult if future social work needs arise. CSW signing off.   [employer], LCSW [phone]

## 2016-05-23 NOTE — Progress Notes (Signed)
Foley d/c'd at [id number] with 350cc urine output

## 2016-05-23 NOTE — Progress Notes (Signed)
05/22/16 1724  PT Visit Information  Last PT Received On 05/22/16  Assistance Needed +1  History of Present Illness Pt underwent R TKA s/p primary OA of R knee. Med Hx includes smoking and HTN with beta blockers.   Precautions  Precautions Fall  Precaution Comments Up with RW and +2 close guard   Restrictions  Weight Bearing Restrictions Yes  RLE Weight Bearing WBAT  Home Living  Family/patient expects to be discharged to: Private residence  Living Arrangements Spouse/significant other  Available Help at Discharge Family  Type of [place] to enter  Entrance Stairs-Number of Steps 2  [place] to live on main level with bedroom/bathroom  Corporate treasurer Yes  Home Equipment Walker - 2 wheels;Crutches;Cane - single point  Prior Function  Level of Independence Independent;Independent with assistive device(s)  Comments Pt uses SPC and crutches as needed  Communication  Communication No difficulties  Pain Assessment  Pain Assessment 0-10  Pain Score 6  Pain Location operative site  Pain Descriptors / Indicators Discomfort;Grimacing;Guarding;Sharp  Pain Intervention(s) Limited activity within patient's tolerance;Monitored during session;Premedicated before session;Ice applied;Utilized relaxation techniques;Patient requesting pain meds-RN notified  Cognition  Arousal/Alertness Awake/alert  Behavior During Therapy WFL for tasks assessed/performed  Overall Cognitive Status Within Functional Limits for tasks assessed  Upper Extremity Assessment  Upper Extremity Assessment Overall WFL for tasks assessed  Lower Extremity Assessment  Lower Extremity Assessment RLE deficits/detail (L LE WFL)  RLE Deficits / Details Pt is able to do 10 SLRs with min assist, he does not buckle with standing  Cervical / Trunk Assessment  Cervical / Trunk Assessment Normal  Bed Mobility  Overal bed mobility Needs  Assistance  Bed Mobility Supine to Sit  Supine to sit Min assist  General bed mobility comments Pt requires min verbal cues and min assist with getting R LE off EOB  Transfers  Overall transfer level Needs assistance  Equipment used Rolling walker (2 wheeled)  Transfers Sit to/from Stand  Sit to Stand Min assist  General transfer comment Pt can move under his own power, but requires cuing for hand and foot placement, and postural preperation  Ambulation/Gait  Ambulation/Gait assistance Min assist  Ambulation Distance (Feet) 4 Feet  Assistive device Rolling walker (2 wheeled)  Gait Pattern/deviations Step-to pattern;Antalgic  General Gait Details Pt is very antalgic, as expected; he does not buckle and uses RW appropriately. Pt prefers to stand on the ball of his foot on R to avoid terminal extension  Gait velocity interpretation <1.8 ft/sec, indicative of risk for recurrent falls  Balance  Overall balance assessment Needs assistance  Sitting-balance support No upper extremity supported;Feet supported  Sitting balance-Leahy Scale Normal  Sitting balance - Comments pt has good postural control  Standing balance support Bilateral upper extremity supported  Standing balance-Leahy Scale Fair  Standing balance comment Pt is stable in standing, although he prefers to stand on the ball of his foot on R to avoud terminal extension  Exercises  Exercises Total Joint  Total Joint Exercises  Ankle Circles/Pumps 20 reps;Both;AROM;Supine (with verbal cuing)  Quad Sets 15 reps;Both;AROM;Supine (with tactile/verbal cuing)  Heel Slides 5 reps;Right;AAROM;Seated (with min assist, tactile/verbal cuing)  Hip ABduction/ADduction 15 reps;Right;AAROM;Seated (with min assist, tactile/verbal cuing)  Straight Leg Raises 10 reps;Right;AAROM;Supine (with min-mod assist tactile/verbal cuing)  Goniometric ROM R knee AAROM 4-86 degrees  PT - End of Session  Equipment Utilized During Treatment Gait belt  Activity Tolerance Patient tolerated treatment well  Patient left in chair;with call bell/phone within reach;with chair alarm set;with SCD's reapplied  Nurse Communication Mobility status  PT Assessment  PT Therapy Diagnosis  Difficulty walking;Abnormality of gait;Generalized weakness;Acute pain  PT Recommendation/Assessment Patient needs continued PT services  PT Problem List Decreased strength;Decreased range of motion;Decreased activity tolerance;Decreased balance;Decreased coordination;Decreased mobility;Decreased knowledge of use of DME;Decreased safety awareness;Decreased knowledge of precautions;Pain  PT Plan  PT Frequency (ACUTE ONLY) BID  PT Treatment/Interventions (ACUTE ONLY) DME instruction;Gait training;Stair training;Functional mobility training;Therapeutic activities;Therapeutic exercise;Balance training;Neuromuscular re-education;Patient/family education  PT Recommendation  Follow Up Recommendations Home health PT  PT equipment Other (comment) (bariatric BSC)  Individuals Consulted  Consulted and Agree with Results and Recommendations Patient  Acute Rehab PT Goals  Patient Stated Goal go home  PT Goal Formulation With patient  Time For Goal Achievement 06/05/16  Potential to Achieve Goals Good  PT Time Calculation  PT Start Time (ACUTE ONLY) 1533  PT Stop Time (ACUTE ONLY) 1558  PT Time Calculation (min) (ACUTE ONLY) 25 min  PT G-Codes **NOT FOR INPATIENT CLASS**  Functional Assessment Tool Used clinical judgment  Functional Limitation Mobility: Walking and moving around  Mobility: Walking and Moving Around Current Status [id number]) CK  Mobility: Walking and Moving Around Goal Status [id number]) CJ  PT General Charges  $$ ACUTE PT VISIT 1 Procedure  PT Evaluation  $PT Eval Moderate Complexity 1 Procedure  PT Treatments  $Therapeutic Exercise 8-22 mins  Late G code entry Greggory Stallion, [place], DPT [phone]

## 2016-05-23 NOTE — Care Management (Addendum)
PT recommended Home with [facility] PT. Patient had been given choice of providers and chose [facility]. Referral has been placed. Lovenox 40 mg injection daily for 14 days called to [address] fro anticipated discharge tomorrow. Copay is $50 patient informed. [facility]  Bariatric 3 n 1 chair to room and has accepted referral.

## 2016-05-23 NOTE — Progress Notes (Signed)
   Subjective: 1 Day Post-Op Procedure(s) (LRB): TOTAL KNEE ARTHROPLASTY (Right) Patient reports pain as severe.   Patient is well, but has had some minor complaints of right knee pain Denies any CP, SOB, ABD pain. We will continue therapy today.    Objective: Vital signs in last 24 hours: Temp:  [97.4 F (36.3 C)-98 F (36.7 C)] 97.5 F (36.4 C) (08/23 0352) Pulse Rate:  [51-71] 71 (08/23 0352) Resp:  [12-20] 16 (08/23 0352) BP: (110-141)/(54-79) 141/77 (08/23 0352) SpO2:  [90 %-97 %] 95 % (08/23 0352) FiO2 (%):  [28 %] 28 % (08/22 1058) Weight:  [143.6 kg (316 lb 8 oz)] 143.6 kg (316 lb 8 oz) (08/22 1056)  Intake/Output from previous day: 08/22 0701 - 08/23 0700 In: 3688.8 [P.O.:960; I.V.:2508.8] Out: 925 [Urine:825; Blood:100] Intake/Output this shift: No intake/output data recorded.   Recent Labs  05/22/16 1128 05/23/16 0436  HGB 14.7 13.7    Recent Labs  05/22/16 1128 05/23/16 0436  WBC 9.0 12.0*  RBC 4.99 4.68  HCT 42.0 39.0*  PLT 175 166    Recent Labs  05/22/16 1128 05/23/16 0436  NA  --  135  K  --  4.0  CL  --  104  CO2  --  26  BUN  --  14  CREATININE 0.81 0.87  GLUCOSE  --  167*  CALCIUM  --  8.0*   No results for input(s): LABPT, INR in the last 72 hours.  EXAM General - Patient is Alert, Appropriate and Oriented Extremity - Neurovascular intact Sensation intact distally Intact pulses distally Dorsiflexion/Plantar flexion intact No cellulitis present Compartment soft Dressing - dressing C/D/I Motor Function - intact, moving foot and toes well on exam.   Past Medical History:  Diagnosis Date  . Arthritis   . Hypercholesterolemia   . Hypertension   . Sleep apnea     Assessment/Plan:   1 Day Post-Op Procedure(s) (LRB): TOTAL KNEE ARTHROPLASTY (Right) Active Problems:   Primary localized osteoarthritis of right knee  Estimated body mass index is 42.93 kg/m as calculated from the following:   Height as of this  encounter: 6' (1.829 m).   Weight as of this encounter: 143.6 kg (316 lb 8 oz). Advance diet Up with therapy  Needs BM Recheck labs in the morning Add Ultram to pain regimen Care management to assist with discharge  DVT Prophylaxis - Lovenox, Foot Pumps and TED hose Weight-Bearing as tolerated to right  leg   T. Rachelle Hora, PA-C [place] 05/23/2016, 7:08 AM

## 2016-05-24 LAB — CBC
HCT: 39.9 % — ABNORMAL LOW (ref 40.0–52.0)
Hemoglobin: 14 g/dL (ref 13.0–18.0)
MCH: 29.1 pg (ref 26.0–34.0)
MCHC: 35.1 g/dL (ref 32.0–36.0)
MCV: 82.7 fL (ref 80.0–100.0)
Platelets: 194 K/uL (ref 150–440)
RBC: 4.83 MIL/uL (ref 4.40–5.90)
RDW: 13.7 % (ref 11.5–14.5)
WBC: 15.3 K/uL — ABNORMAL HIGH (ref 3.8–10.6)

## 2016-05-24 LAB — BASIC METABOLIC PANEL WITH GFR
Anion gap: 5 (ref 5–15)
BUN: 10 mg/dL (ref 6–20)
CO2: 27 mmol/L (ref 22–32)
Calcium: 8 mg/dL — ABNORMAL LOW (ref 8.9–10.3)
Chloride: 102 mmol/L (ref 101–111)
Creatinine, Ser: 0.78 mg/dL (ref 0.61–1.24)
GFR calc Af Amer: >60 mL/min
GFR calc non Af Amer: >60 mL/min
Glucose, Bld: 142 mg/dL — ABNORMAL HIGH (ref 65–99)
Potassium: 3.8 mmol/L (ref 3.5–5.1)
Sodium: 134 mmol/L — ABNORMAL LOW (ref 135–145)

## 2016-05-24 MED ORDER — LACTULOSE 10 GM/15ML PO SOLN: 20.0000 g | Freq: Two times a day (BID) | ORAL | Status: DC | PRN

## 2016-05-24 MED ORDER — ENOXAPARIN SODIUM 40 MG/0.4ML ~~LOC~~ SOLN: 40.0000 mg | SUBCUTANEOUS | 0 refills | Status: DC

## 2016-05-24 MED ORDER — FLEET ENEMA 7-19 GM/118ML RE ENEM: 1.0000 | ENEMA | Freq: Every day | RECTAL | Status: DC | PRN

## 2016-05-24 MED ORDER — TRAMADOL HCL 50 MG PO TABS: 50.0000 mg | ORAL_TABLET | Freq: Four times a day (QID) | ORAL | 0 refills | Status: DC

## 2016-05-24 MED ORDER — OXYCODONE HCL 5 MG PO TABS: 5.0000 mg | ORAL_TABLET | ORAL | 0 refills | Status: DC | PRN

## 2016-05-24 NOTE — Discharge Instructions (Signed)

## 2016-05-24 NOTE — Progress Notes (Signed)
Subjective: 2 Days Post-Op Procedure(s) (LRB): TOTAL KNEE ARTHROPLASTY (Right) Patient reports pain as mild.   Patient is well, and has had no acute complaints or problems Denies any CP, SOB, ABD pain. We will continue therapy today.   Objective: Vital signs in last 24 hours: Temp:  [97.8 F (36.6 C)-99.1 F (37.3 C)] 98.7 F (37.1 C) (08/24 0402) Pulse Rate:  [64-74] 64 (08/24 0402) Resp:  [20] 20 (08/24 0402) BP: (132-148)/(86-112) 148/86 (08/24 0402) SpO2:  [97 %-99 %] 97 % (08/24 0402)  Intake/Output from previous day: 08/23 0701 - 08/24 0700 In: 240 [P.O.:240] Out: 1375 [Urine:1375] Intake/Output this shift: No intake/output data recorded.   Recent Labs  05/22/16 1128 05/23/16 0436 05/24/16 0514  HGB 14.7 13.7 14.0    Recent Labs  05/23/16 0436 05/24/16 0514  WBC 12.0* 15.3*  RBC 4.68 4.83  HCT 39.0* 39.9*  PLT 166 194    Recent Labs  05/23/16 0436 05/24/16 0514  NA 135 134*  K 4.0 3.8  CL 104 102  CO2 26 27  BUN 14 10  CREATININE 0.87 0.78  GLUCOSE 167* 142*  CALCIUM 8.0* 8.0*   No results for input(s): LABPT, INR in the last 72 hours.  EXAM General - Patient is Alert, Appropriate and Oriented Extremity - Neurovascular intact Sensation intact distally Intact pulses distally Dorsiflexion/Plantar flexion intact No cellulitis present Compartment soft Dressing - dressing C/D/I Motor Function - intact, moving foot and toes well on exam.   Bulky dressing removed today, honeycomb dressing applied.  Past Medical History:  Diagnosis Date  . Arthritis   . Hypercholesterolemia   . Hypertension   . Sleep apnea     Assessment/Plan:   2 Days Post-Op Procedure(s) (LRB): TOTAL KNEE ARTHROPLASTY (Right) Active Problems:   Primary localized osteoarthritis of right knee  Estimated body mass index is 42.93 kg/m as calculated from the following:   Height as of this encounter: 6' (1.829 m).   Weight as of this encounter: 143.6 kg (316 lb 8  oz). Advance diet Up with therapy  Needs BM Labs reviewed this AM. Pain better this AM. Will plan on discharge either today or tomorrow pending PT and BM. Care management to assist with discharge, PT currently recommending SNF.  DVT Prophylaxis - Lovenox, Foot Pumps and TED hose Weight-Bearing as tolerated to right  leg   J. Cameron Proud, PA-C [place] 05/24/2016, 9:45 AM

## 2016-05-24 NOTE — Discharge Summary (Signed)
Physician Discharge Summary  Patient ID: Daniel Boyer. MRN: [id number] DOB/AGE: [birth year] 70 y.o.  Admit date: 05/22/2016 Discharge date: 05/24/2016  Admission Diagnoses:  primary osteoarthritis   Discharge Diagnoses: Patient Active Problem List   Diagnosis Date Noted  . Primary localized osteoarthritis of right knee 05/22/2016    Past Medical History:  Diagnosis Date  . Arthritis   . Hypercholesterolemia   . Hypertension   . Sleep apnea      Transfusion: none   Consultants (if any):   Discharged Condition: Improved  Hospital Course: Marvins Starratt. is an 70 y.o. male who was admitted 05/22/2016 with a diagnosis of right knee osteoarthritis and went to the operating room on 05/22/2016 and underwent the above named procedures.    Surgeries: Procedure(s): TOTAL KNEE ARTHROPLASTY on 05/22/2016 Patient tolerated the surgery well. Taken to PACU where she was stabilized and then transferred to the orthopedic floor.  Started on Lovenox 30 q 12 hrs. Foot pumps applied bilaterally at 80 mm. Heels elevated on bed with rolled towels. No evidence of DVT. Negative Homan. Physical therapy started on day #1 for gait training and transfer. OT started day #1 for ADL and assisted devices.  Patient's foley was d/c on day #1. Patient's IV  was d/c on day #2.  On post op day #2 patient was stable and ready for discharge to home with HHPT.  Implants: Medacta GMK sphere 5+ femur, 5 tibia with 11 mm insert and 3 patella, all components cemented  He was given perioperative antibiotics:  Anti-infectives    Start     Dose/Rate Route Frequency Ordered Stop   05/22/16 1400  ceFAZolin (ANCEF) 3 g in dextrose 5 % 50 mL IVPB     3 g 130 mL/hr over 30 Minutes Intravenous Every 6 hours 05/22/16 1057 05/23/16 0134   05/22/16 0539  ceFAZolin (ANCEF) 2-4 GM/100ML-% IVPB    Comments:  KENNEDY, ASHLEY: cabinet override      05/22/16 0539 05/22/16 1744   05/22/16 0200  ceFAZolin (ANCEF) IVPB 2g/100 mL  premix     2 g 200 mL/hr over 30 Minutes Intravenous  Once 05/22/16 0155 05/22/16 0742    .  He was given sequential compression devices, early ambulation, and lovenox for DVT prophylaxis.  He benefited maximally from the hospital stay and there were no complications.    Recent vital signs:  Vitals:   05/23/16 1940 05/24/16 0402  BP: (!) 144/90 (!) 148/86  Pulse: 74 64  Resp: 20 20  Temp: 97.8 F (36.6 C) 98.7 F (37.1 C)    Recent laboratory studies:  Lab Results  Component Value Date   HGB 14.0 05/24/2016   HGB 13.7 05/23/2016   HGB 14.7 05/22/2016   Lab Results  Component Value Date   WBC 15.3 (H) 05/24/2016   PLT 194 05/24/2016   Lab Results  Component Value Date   INR 0.93 05/09/2016   Lab Results  Component Value Date   NA 134 (L) 05/24/2016   K 3.8 05/24/2016   CL 102 05/24/2016   CO2 27 05/24/2016   BUN 10 05/24/2016   CREATININE 0.78 05/24/2016   GLUCOSE 142 (H) 05/24/2016    Discharge Medications:     Medication List    TAKE these medications   aspirin 81 MG chewable tablet Chew 81 mg by mouth daily.   Fish Oil Oil Take 2 capsules by mouth daily. 500 mg   fluticasone 50 MCG/ACT nasal spray Commonly known as:  FLONASE Place 1 spray into both nostrils daily.   glucosamine-chondroitin 500-400 MG tablet Take 2 tablets by mouth daily.   ibuprofen 200 MG tablet Commonly known as:  ADVIL,MOTRIN Take 400 mg by mouth every 6 (six) hours as needed.   metoprolol succinate 100 MG 24 hr tablet Commonly known as:  TOPROL-XL Take 1 tablet by mouth daily.   oxyCODONE 5 MG immediate release tablet Commonly known as:  Oxy IR/ROXICODONE Take 1-2 tablets (5-10 mg total) by mouth every 3 (three) hours as needed for breakthrough pain.   pravastatin 40 MG tablet Commonly known as:  PRAVACHOL Take 40 mg by mouth at bedtime.   traMADol 50 MG tablet Commonly known as:  ULTRAM Take 1-2 tablets (50-100 mg total) by mouth every 6 (six) hours.        Diagnostic Studies: Dg Knee 1-2 Views Right  Result Date: 05/22/2016 CLINICAL DATA:  Right total knee replacement EXAM: RIGHT KNEE - 1-2 VIEW COMPARISON:  02/28/2016 FINDINGS: Changes of right knee replacement. No hardware or bony complicating feature. Soft tissue gas noted. IMPRESSION: Right knee replacement.  No complicating feature. Electronically Signed   By: Rolm Baptise M.D.   On: 05/22/2016 10:27    Disposition: 01-Home or Self Care       Signed: Dorise Hiss [facility] 05/24/2016, 4:05 PM

## 2016-05-24 NOTE — Progress Notes (Signed)
Physical Therapy Treatment Patient Details Name: Daniel Boyer. MRN: [id number] DOB: 08-03-1946 Today's Date: 05/24/2016    History of Present Illness Pt. was admitted to [facility] with a right TKR secondary to OA of the right knee.    PT Comments    Pt is able to ambulate to/from the PT gym with multiple standing rest breaks but no safety issues.  We did 125 ft with KI and pt did better with more confidence, no buckling and overall more consistent cadence. He was able to negotiate up/down steps with heavy reliance on R rail and cuing/encouragement.  Pt showed good effort t/o the session.    Follow Up Recommendations  Home health PT     Equipment Recommendations       Recommendations for Other Services       Precautions / Restrictions Precautions Precautions: Fall Restrictions RLE Weight Bearing: Weight bearing as tolerated    Mobility  Bed Mobility               General bed mobility comments: Pt in recliner on arrival  Transfers Overall transfer level: Modified independent Equipment used: Rolling walker (2 wheeled) Transfers: Sit to/from Stand Sit to Stand: Min guard         General transfer comment: Pt needs cuing for positioning (feet and hands) and encouragement but he was able to get up from sitting X2 w/o direct assist  Ambulation/Gait Ambulation/Gait assistance: Min guard Ambulation Distance (Feet): 250 Feet Assistive device: Rolling walker (2 wheeled) (KI)       General Gait Details: Pt did well with ambulation but did again need multiple standing rest breaks and did have a few small knee buckling episodes (able to self arrest) he did better and showed more consitent gait and confidence with KI. Pt fatigued but safe with the effort.    Stairs Stairs: Yes Stairs assistance: Min guard Stair Management: One rail Right;Sideways Number of Stairs: 4 General stair comments: Pt initially hesistant with WBing on R during L foot stepping up, he is able to  get up steps w/o direct assist but was heavily reliant on UEs  Wheelchair Mobility    Modified Rankin (Stroke Patients Only)       Balance                                    Cognition Arousal/Alertness: Awake/alert Behavior During Therapy: WFL for tasks assessed/performed;Anxious Overall Cognitive Status: Within Functional Limits for tasks assessed                      Exercises Total Joint Exercises Ankle Circles/Pumps: 15 reps;AROM Quad Sets: 15 reps;AROM Gluteal Sets: 15 reps;AROM Short Arc Quad: 15 reps;Strengthening Heel Slides: 15 reps;Strengthening Hip ABduction/ADduction: 15 reps;Strengthening    General Comments        Pertinent Vitals/Pain Pain Assessment: 0-10 Pain Score: 7     Home Living                      Prior Function            PT Goals (current goals can now be found in the care plan section) Progress towards PT goals: Progressing toward goals    Frequency  BID    PT Plan Current plan remains appropriate    Co-evaluation             End of  Session Equipment Utilized During Treatment: Gait belt Activity Tolerance: Patient tolerated treatment well Patient left: with call bell/phone within reach;with chair alarm set     Time: 0911-1009 PT Time Calculation (min) (ACUTE ONLY): 58 min  Charges:  $Gait Training: 23-37 mins $Therapeutic Exercise: 8-22 mins $Therapeutic Activity: 8-22 mins                    G Codes:      Kreg Shropshire, DPT 05/24/2016, 12:50 PM

## 2017-10-16 ENCOUNTER — Other Ambulatory Visit: Payer: Self-pay | Admitting: Internal Medicine

## 2017-10-16 DIAGNOSIS — R1031 Right lower quadrant pain: Secondary | ICD-10-CM

## 2017-10-29 ENCOUNTER — Ambulatory Visit
Admission: RE | Admit: 2017-10-29 | Discharge: 2017-10-29 | Disposition: A | Discharge status: Home / Self Care | Payer: BLUE CROSS/BLUE SHIELD | Source: Ambulatory Visit | Attending: Internal Medicine | Admitting: Internal Medicine

## 2017-10-29 DIAGNOSIS — M5137 Other intervertebral disc degeneration, lumbosacral region: Secondary | ICD-10-CM | POA: Insufficient documentation

## 2017-10-29 DIAGNOSIS — D7389 Other diseases of spleen: Secondary | ICD-10-CM | POA: Diagnosis not present

## 2017-10-29 DIAGNOSIS — K760 Fatty (change of) liver, not elsewhere classified: Secondary | ICD-10-CM | POA: Diagnosis not present

## 2017-10-29 DIAGNOSIS — R1031 Right lower quadrant pain: Secondary | ICD-10-CM

## 2017-10-29 DIAGNOSIS — M5136 Other intervertebral disc degeneration, lumbar region: Secondary | ICD-10-CM | POA: Insufficient documentation

## 2017-10-29 DIAGNOSIS — R918 Other nonspecific abnormal finding of lung field: Secondary | ICD-10-CM | POA: Insufficient documentation

## 2017-10-29 LAB — POCT I-STAT CREATININE: Creatinine, Ser: 1.1 mg/dL (ref 0.61–1.24)

## 2017-10-29 MED ORDER — IOPAMIDOL (ISOVUE-300) INJECTION 61%
125.0000 mL | Freq: Once | INTRAVENOUS | Status: AC | PRN
  Administered 2017-10-29: 125 mL via INTRAVENOUS

## 2018-02-21 ENCOUNTER — Encounter: Payer: Self-pay | Admitting: *Deleted

## 2018-02-25 ENCOUNTER — Encounter
Admission: RE | Disposition: A | Discharge status: Home / Self Care | Payer: Self-pay | Source: Ambulatory Visit | Attending: Internal Medicine

## 2018-02-25 ENCOUNTER — Ambulatory Visit: Payer: BLUE CROSS/BLUE SHIELD | Admitting: Anesthesiology

## 2018-02-25 ENCOUNTER — Ambulatory Visit
Admission: RE | Admit: 2018-02-25 | Discharge: 2018-02-25 | Disposition: A | Discharge status: Home / Self Care | Payer: BLUE CROSS/BLUE SHIELD | Source: Ambulatory Visit | Attending: Internal Medicine | Admitting: Internal Medicine

## 2018-02-25 ENCOUNTER — Encounter: Payer: Self-pay | Admitting: Anesthesiology

## 2018-02-25 DIAGNOSIS — I10 Essential (primary) hypertension: Secondary | ICD-10-CM | POA: Insufficient documentation

## 2018-02-25 DIAGNOSIS — Z79899 Other long term (current) drug therapy: Secondary | ICD-10-CM | POA: Diagnosis not present

## 2018-02-25 DIAGNOSIS — E7800 Pure hypercholesterolemia, unspecified: Secondary | ICD-10-CM | POA: Diagnosis not present

## 2018-02-25 DIAGNOSIS — G4730 Sleep apnea, unspecified: Secondary | ICD-10-CM | POA: Insufficient documentation

## 2018-02-25 DIAGNOSIS — Z86010 Personal history of colonic polyps: Secondary | ICD-10-CM | POA: Diagnosis not present

## 2018-02-25 DIAGNOSIS — Z7982 Long term (current) use of aspirin: Secondary | ICD-10-CM | POA: Insufficient documentation

## 2018-02-25 DIAGNOSIS — K5730 Diverticulosis of large intestine without perforation or abscess without bleeding: Secondary | ICD-10-CM | POA: Diagnosis not present

## 2018-02-25 DIAGNOSIS — K640 First degree hemorrhoids: Secondary | ICD-10-CM | POA: Diagnosis not present

## 2018-02-25 DIAGNOSIS — Z1211 Encounter for screening for malignant neoplasm of colon: Secondary | ICD-10-CM | POA: Insufficient documentation

## 2018-02-25 DIAGNOSIS — F17200 Nicotine dependence, unspecified, uncomplicated: Secondary | ICD-10-CM | POA: Insufficient documentation

## 2018-02-25 DIAGNOSIS — J449 Chronic obstructive pulmonary disease, unspecified: Secondary | ICD-10-CM | POA: Insufficient documentation

## 2018-02-25 HISTORY — PX: COLONOSCOPY WITH PROPOFOL: SHX5780

## 2018-02-25 HISTORY — DX: Chronic obstructive pulmonary disease, unspecified: J44.9

## 2018-02-25 HISTORY — DX: Personal history of other diseases of the circulatory system: Z86.79

## 2018-02-25 HISTORY — DX: Obesity, unspecified: E66.9

## 2018-02-25 HISTORY — DX: Other chest pain: R07.89

## 2018-02-25 HISTORY — DX: Personal history of colonic polyps: Z86.010

## 2018-02-25 SURGERY — COLONOSCOPY WITH PROPOFOL
Anesthesia: General

## 2018-02-25 MED ORDER — LIDOCAINE HCL (PF) 1 % IJ SOLN
INTRAMUSCULAR | Status: AC
  Administered 2018-02-25: 0.3 mL via INTRADERMAL
  Filled 2018-02-25: qty 2

## 2018-02-25 MED ORDER — LIDOCAINE HCL (PF) 2 % IJ SOLN
INTRAMUSCULAR | Status: AC
  Filled 2018-02-25: qty 10

## 2018-02-25 MED ORDER — PROPOFOL 500 MG/50ML IV EMUL
INTRAVENOUS | Status: AC
  Filled 2018-02-25: qty 50

## 2018-02-25 MED ORDER — PROPOFOL 10 MG/ML IV BOLUS
INTRAVENOUS | Status: DC | PRN
  Administered 2018-02-25: 50 mg via INTRAVENOUS
  Administered 2018-02-25: 25 mg via INTRAVENOUS

## 2018-02-25 MED ORDER — PROPOFOL 500 MG/50ML IV EMUL
INTRAVENOUS | Status: DC | PRN
  Administered 2018-02-25: 125 ug/kg/min via INTRAVENOUS

## 2018-02-25 MED ORDER — LIDOCAINE HCL (PF) 1 % IJ SOLN
2.0000 mL | Freq: Once | INTRAMUSCULAR | Status: AC
  Administered 2018-02-25: 0.3 mL via INTRADERMAL

## 2018-02-25 MED ORDER — SODIUM CHLORIDE 0.9 % IV SOLN
INTRAVENOUS | Status: DC
  Administered 2018-02-25: 1000 mL via INTRAVENOUS

## 2018-02-25 NOTE — Anesthesia Postprocedure Evaluation (Signed)
Anesthesia Post Note  Patient: Daniel Boyer.  Procedure(s) Performed: COLONOSCOPY WITH PROPOFOL (N/A )  Patient location during evaluation: Endoscopy Anesthesia Type: General Level of consciousness: awake and alert Pain management: pain level controlled Vital Signs Assessment: post-procedure vital signs reviewed and stable Respiratory status: spontaneous breathing, nonlabored ventilation, respiratory function stable and patient connected to nasal cannula oxygen Cardiovascular status: blood pressure returned to baseline and stable Postop Assessment: no apparent nausea or vomiting Anesthetic complications: no     Last Vitals:  Vitals:   02/25/18 0836 02/25/18 0846  BP: (!) 102/33 120/79  Pulse: 64 64  Resp: 16 16  Temp: (!) 35.8 C   SpO2: 96% 96%    Last Pain:  Vitals:   02/25/18 0846  TempSrc:   PainSc: 0-No pain                 Corynn Solberg S

## 2018-02-25 NOTE — Anesthesia Post-op Follow-up Note (Signed)
Anesthesia QCDR form completed.        

## 2018-02-25 NOTE — Interval H&P Note (Signed)
History and Physical Interval Note:  02/25/2018 8:12 AM  Daniel Boyer.  has presented today for surgery, with the diagnosis of HX COLON POLYPS CHRONIC CONSTIPATION  ABD PAIN RLQ  The various methods of treatment have been discussed with the patient and family. After consideration of risks, benefits and other options for treatment, the patient has consented to  Procedure(s): COLONOSCOPY WITH PROPOFOL (N/A) as a surgical intervention .  The patient's history has been reviewed, patient examined, no change in status, stable for surgery.  I have reviewed the patient's chart and labs.  Questions were answered to the patient's satisfaction.     [place], [place]

## 2018-02-25 NOTE — Op Note (Signed)
[facility] Gastroenterology Patient Name: Daniel Boyer Procedure Date: 02/25/2018 8:14 AM MRN: [id number] Account #: [id number] Date of Birth: May 15, 1946 Admit Type: Outpatient Age: 72 Room: [facility] ENDO ROOM 3 Gender: Male Note Status: Finalized Procedure:            Colonoscopy Indications:          High risk colon cancer surveillance: Personal history                        of colonic polyps Providers:            Benay Pike. Alice Reichert MD, MD Referring MD:         Leonie Douglas. Doy Hutching, MD (Referring MD) Medicines:            Propofol per Anesthesia Complications:        No immediate complications. Procedure:            Pre-Anesthesia Assessment:                       - The risks and benefits of the procedure and the                        sedation options and risks were discussed with the                        patient. All questions were answered and informed                        consent was obtained.                       - Patient identification and proposed procedure were                        verified prior to the procedure by the nurse. The                        procedure was verified in the procedure room.                       - ASA Grade Assessment: III - A patient with severe                        systemic disease.                       - After reviewing the risks and benefits, the patient                        was deemed in satisfactory condition to undergo the                        procedure.                       After obtaining informed consent, the colonoscope was                        passed under direct vision. Throughout the procedure,  the patient's blood pressure, pulse, and oxygen                        saturations were monitored continuously. The                        Colonoscope was introduced through the anus and                        advanced to the the cecum, identified by appendiceal   orifice and ileocecal valve. The colonoscopy was                        performed without difficulty. The patient tolerated the                        procedure well. The quality of the bowel preparation                        was good. The ileocecal valve, appendiceal orifice, and                        rectum were photographed. Findings:      The perianal and digital rectal examinations were normal. Pertinent       negatives include normal sphincter tone and no palpable rectal lesions.      Many small and large-mouthed diverticula were found in the sigmoid colon       and descending colon.      Non-bleeding internal hemorrhoids were found during retroflexion. The       hemorrhoids were Grade I (internal hemorrhoids that do not prolapse).      The exam was otherwise without abnormality. Impression:           - Diverticulosis in the sigmoid colon and in the                        descending colon.                       - Non-bleeding internal hemorrhoids.                       - The examination was otherwise normal.                       - No specimens collected. Recommendation:       - Patient has a contact number available for                        emergencies. The signs and symptoms of potential                        delayed complications were discussed with the patient.                        Return to normal activities tomorrow. Written discharge                        instructions were provided to the patient.                       -  Resume previous diet.                       - Continue present medications.                       - Repeat colonoscopy in 5 years for surveillance.                       - Return to GI office PRN.                       - The findings and recommendations were discussed with                        the patient and their spouse. Procedure Code(s):    --- Professional ---                       O6712, Colorectal cancer screening; colonoscopy on                         individual at high risk Diagnosis Code(s):    --- Professional ---                       K57.30, Diverticulosis of large intestine without                        perforation or abscess without bleeding                       K64.0, First degree hemorrhoids                       Z86.010, Personal history of colonic polyps CPT copyright 2017 American Medical Association. All rights reserved. The codes documented in this report are preliminary and upon coder review may  be revised to meet current compliance requirements. Efrain Sella MD, MD 02/25/2018 8:35:41 AM This report has been signed electronically. Number of Addenda: 0 Note Initiated On: 02/25/2018 8:14 AM Scope Withdrawal Time: 0 hours 7 minutes 2 seconds  Total Procedure Duration: 0 hours 9 minutes 18 seconds       [facility]

## 2018-02-25 NOTE — Anesthesia Preprocedure Evaluation (Signed)
Anesthesia Evaluation  Patient identified by MRN, date of birth, ID band Patient awake    Reviewed: Allergy & Precautions, NPO status , Patient's Chart, lab work & pertinent test results, reviewed documented beta blocker date and time   Airway Mallampati: III  TM Distance: >3 FB     Dental  (+) Chipped   Pulmonary sleep apnea , COPD, Current Smoker,           Cardiovascular hypertension, Pt. on medications and Pt. on home beta blockers      Neuro/Psych    GI/Hepatic   Endo/Other    Renal/GU      Musculoskeletal  (+) Arthritis ,   Abdominal   Peds  Hematology   Anesthesia Other Findings Obese.  Reproductive/Obstetrics                             Anesthesia Physical Anesthesia Plan  ASA: III  Anesthesia Plan: General   Post-op Pain Management:    Induction: Intravenous  PONV Risk Score and Plan:   Airway Management Planned:   Additional Equipment:   Intra-op Plan:   Post-operative Plan:   Informed Consent: I have reviewed the patients History and Physical, chart, labs and discussed the procedure including the risks, benefits and alternatives for the proposed anesthesia with the patient or authorized representative who has indicated his/her understanding and acceptance.     Plan Discussed with: CRNA  Anesthesia Plan Comments:         Anesthesia Quick Evaluation

## 2018-02-25 NOTE — H&P (Signed)
Outpatient short stay form Pre-procedure 02/25/2018 8:11 AM Genella Bas K. Alice Reichert, M.D.  Primary Physician: Georgie Chard, M.D.  Reason for visit:  Personal hx colon polyps  History of present illness:  Patient presents for colonoscopy for screening/surveillance. The patient denies complaints of abdominal pain, significant change in bowel habits, or rectal bleeding.     Current Facility-Administered Medications:  .  0.9 %  sodium chloride infusion, , Intravenous, Continuous, [place], Benay Pike, MD, Last Rate: 20 mL/hr at 02/25/18 5427  Medications Prior to Admission  Medication Sig Dispense Refill Last Dose  . budesonide-formoterol (SYMBICORT) 160-4.5 MCG/ACT inhaler Inhale 2 puffs into the lungs 2 (two) times daily.   02/24/2018  . OMEGA-3 FATTY ACIDS PO Take 1,000 mg by mouth 2 (two) times daily.     Marland Kitchen aspirin 81 MG chewable tablet Chew 81 mg by mouth daily.   02/24/2018  . enoxaparin (LOVENOX) 40 MG/0.4ML injection Inject 0.4 mLs (40 mg total) into the skin daily. 14 Syringe 0   . Fish Oil OIL Take 2 capsules by mouth daily. 500 mg   05/14/2016  . fluticasone (FLONASE) 50 MCG/ACT nasal spray Place 1 spray into both nostrils daily.   05/14/2016  . glucosamine-chondroitin 500-400 MG tablet Take 2 tablets by mouth daily.   05/14/2016  . ibuprofen (ADVIL,MOTRIN) 200 MG tablet Take 400 mg by mouth every 6 (six) hours as needed.   Past Month at Unknown time  . metoprolol succinate (TOPROL-XL) 100 MG 24 hr tablet Take 1 tablet by mouth daily.  1 02/25/2018 at 0600  . oxyCODONE (OXY IR/ROXICODONE) 5 MG immediate release tablet Take 1-2 tablets (5-10 mg total) by mouth every 3 (three) hours as needed for breakthrough pain. 60 tablet 0   . pravastatin (PRAVACHOL) 40 MG tablet Take 40 mg by mouth at bedtime.   0 05/14/2016  . traMADol (ULTRAM) 50 MG tablet Take 1-2 tablets (50-100 mg total) by mouth every 6 (six) hours. 60 tablet 0      Allergies  Allergen Reactions  . Celebrex [Celecoxib]      Past  Medical History:  Diagnosis Date  . Arthritis   . COPD (chronic obstructive pulmonary disease) ([place])   . History of atrial flutter   . History of colon polyps   . Hypercholesterolemia   . Hypertension   . Non-cardiac chest pain   . Obesity   . Sleep apnea     Review of systems:      Physical Exam  Gen: Alert, oriented. Appears stated age.  HEENT: [place]/AT. PERRLA. Lungs: CTA, no wheezes. CV: RR nl S1, S2. Abd: soft, benign, no masses. BS+ Ext: No edema. Pulses 2+    Planned procedures: Colonoscopy. The patient understands the nature of the planned procedure, indications, risks, alternatives and potential complications including but not limited to bleeding, infection, perforation, damage to internal organs and possible oversedation/side effects from anesthesia. The patient agrees and gives consent to proceed.  Please refer to procedure notes for findings, recommendations and patient disposition/instructions.    Jordani Nunn K. Alice Reichert, M.D. Gastroenterology 02/25/2018  8:11 AM

## 2018-02-25 NOTE — Transfer of Care (Signed)
Immediate Anesthesia Transfer of Care Note  Patient: Daniel Boyer.  Procedure(s) Performed: COLONOSCOPY WITH PROPOFOL (N/A )  Patient Location: Endoscopy Unit  Anesthesia Type:MAC  Level of Consciousness: awake  Airway & Oxygen Therapy: Patient Spontanous Breathing  Post-op Assessment: Report given to RN  Post vital signs: stable  Last Vitals:  Vitals Value Taken Time  BP    Temp    Pulse    Resp    SpO2      Last Pain:  Vitals:   02/25/18 0711  TempSrc: Tympanic         Complications: No apparent anesthesia complications

## 2018-02-26 ENCOUNTER — Encounter: Payer: Self-pay | Admitting: Internal Medicine

## 2018-12-15 IMAGING — CT CT ABD-PELV W/ CM
2 of 5 series · 16 of 46 positions shown, 18 images · IV contrast (iopamidol)
Comparison: CT abdomen pelvis of 03/12/2016

CLINICAL DATA: Right lower quadrant abdominal pain for 6 weeks,
history of prior umbilical hernia repair many years ago

EXAM:
CT ABDOMEN AND PELVIS WITH CONTRAST
TECHNIQUE: Multidetector CT imaging of the abdomen and pelvis was performed
using the standard protocol following bolus administration of
intravenous contrast.
CONTRAST:  125mL FBQQLQ-BEE IOPAMIDOL (FBQQLQ-BEE) INJECTION 61%

[Series 2: abd pelvis 5.00 br36 s3 · axial · 1.16mm/px · z∈[-1674,-1229]mm · 13 of 101 slices shown, 15 images]
[im 6/101  soft-tissue]
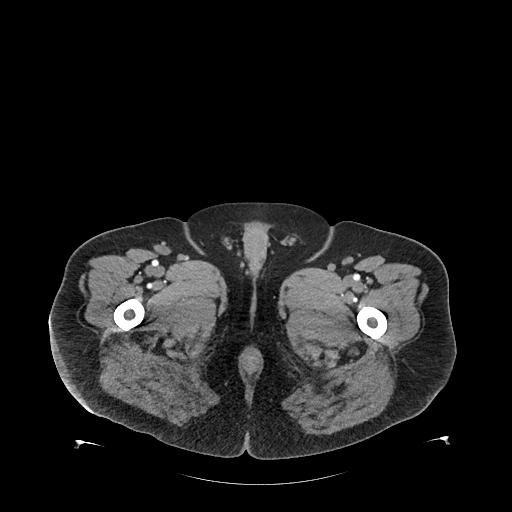
[im 6/101  bone]
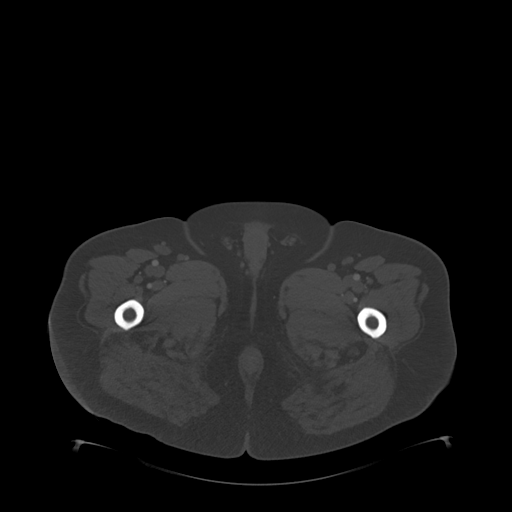
[im 16/101  soft-tissue]
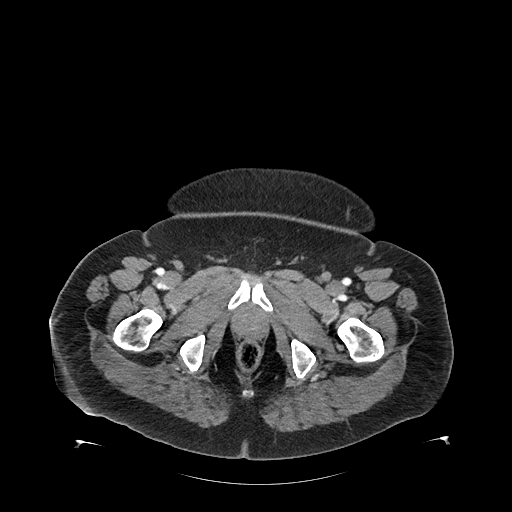
[im 22/101  soft-tissue]
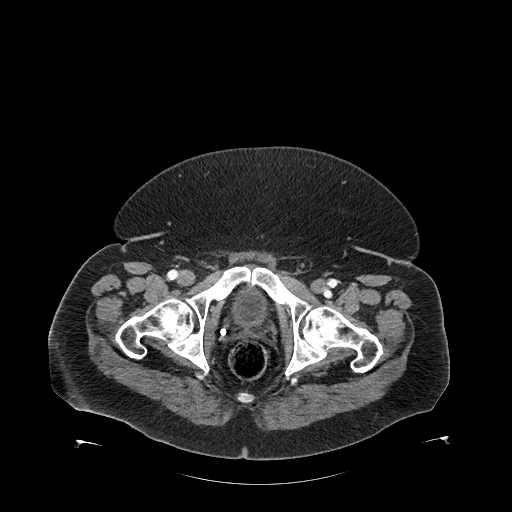
[im 27/101  soft-tissue]
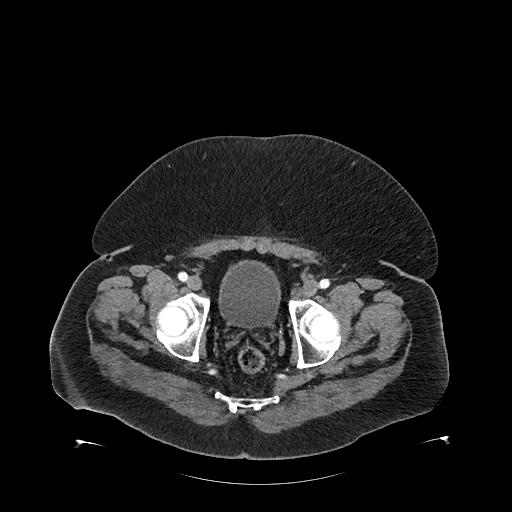
[im 37/101  soft-tissue]
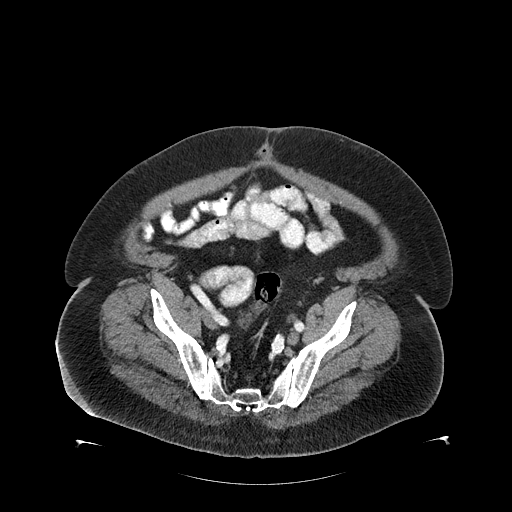
[im 43/101  soft-tissue]
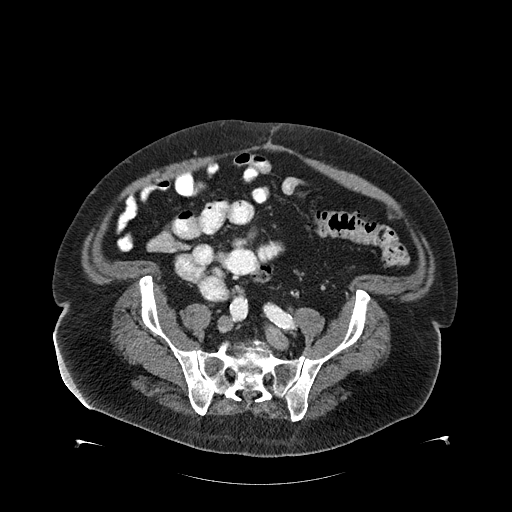
[im 53/101  soft-tissue]
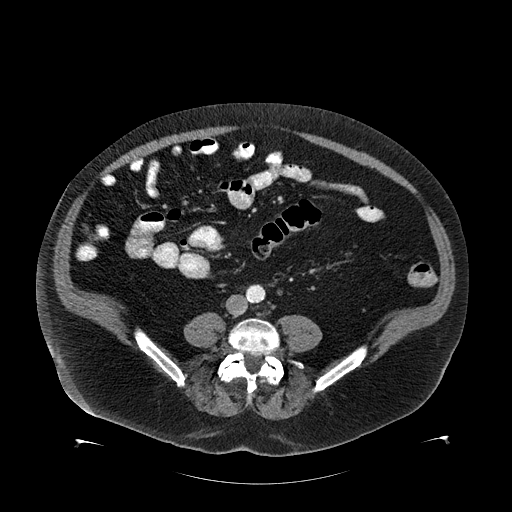
[im 58/101  soft-tissue]
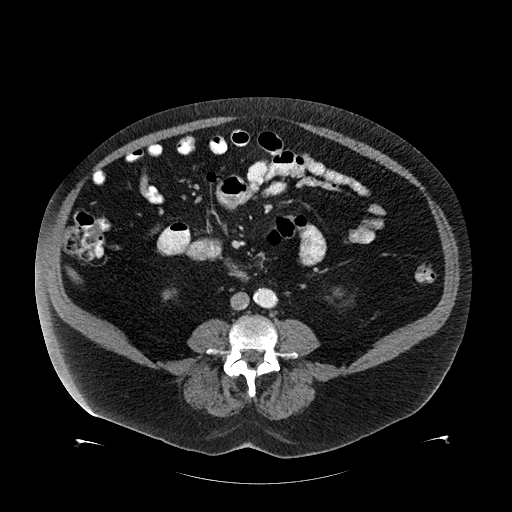
[im 64/101  soft-tissue]
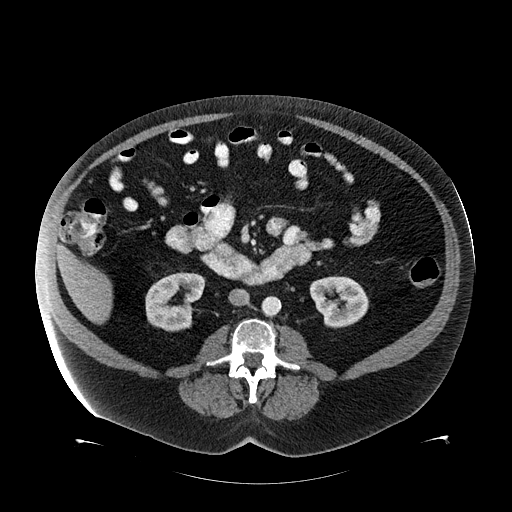
[im 64/101  bone]
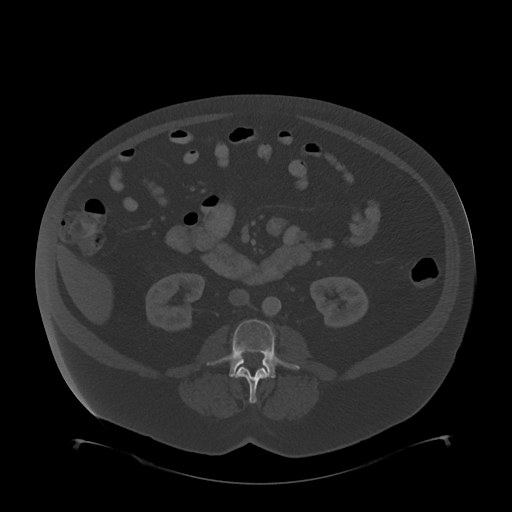
[im 74/101  soft-tissue]
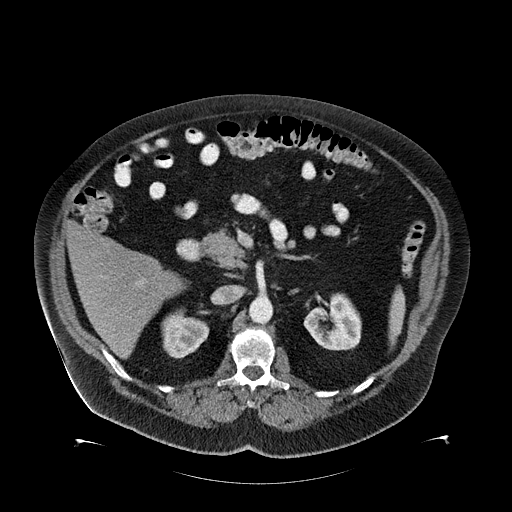
[im 79/101  soft-tissue]
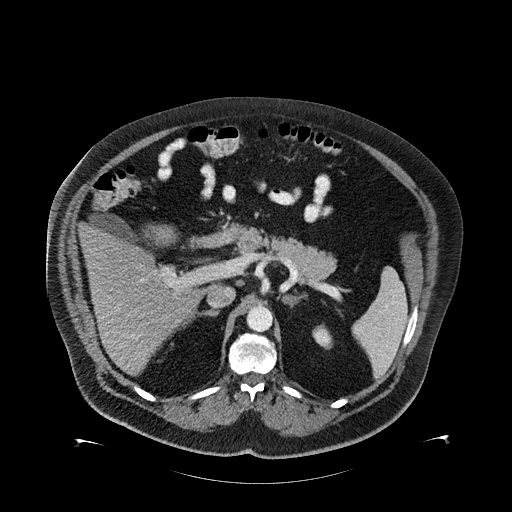
[im 85/101  soft-tissue]
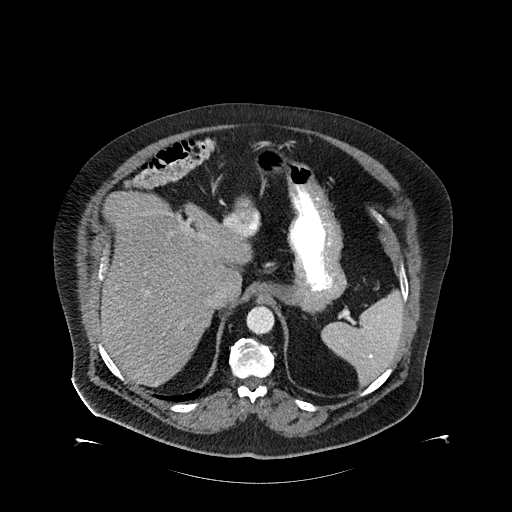
[im 95/101  soft-tissue]
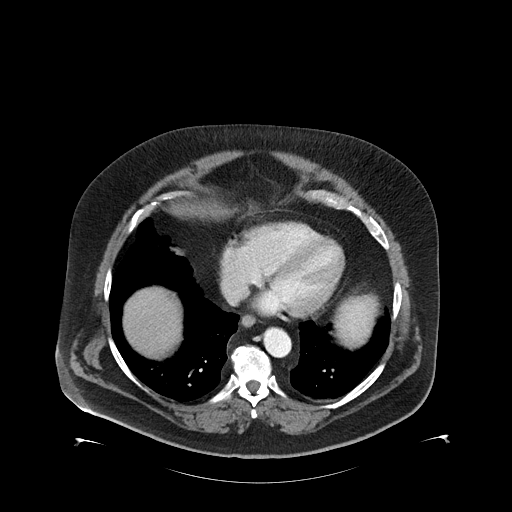

[Series 4: abd pelvis 2.00 br40 s3 cor · coronal · 1.00mm/px · 3 of 213 slices shown]
[im 71/213  soft-tissue]
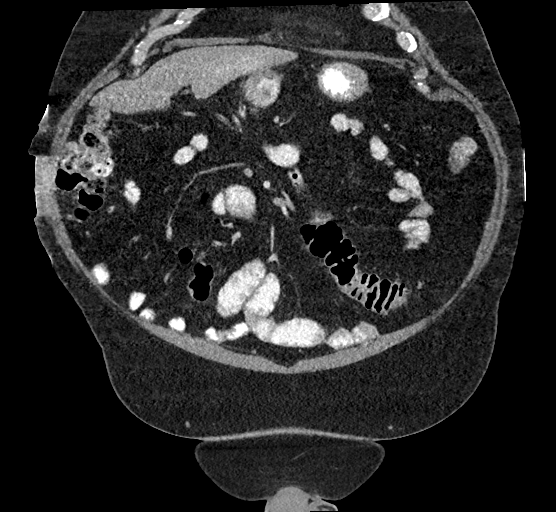
[im 95/213  soft-tissue]
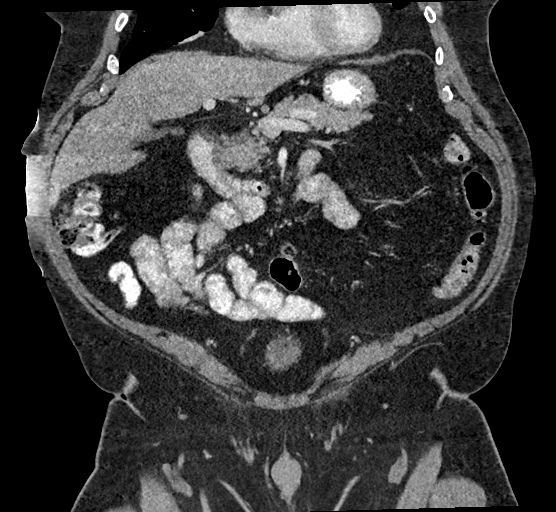
[im 118/213  soft-tissue]
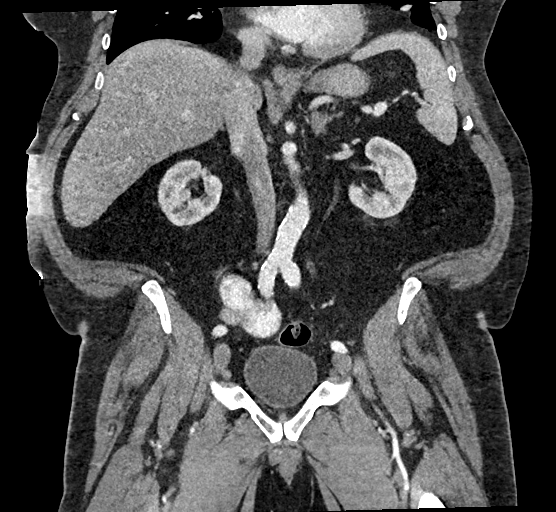

[16 of 46 positions shown; findings below may reference images not displayed]

FINDINGS: Lower chest: Linear scarring and/or atelectasis is noted primarily
in the left lower lobe-lingula. No pneumonia or pleural effusion is
seen.

Hepatobiliary: As noted on prior CT, the liver is diffusely low in
attenuation consistent with diffuse fatty infiltration. No calcified
gallstones are seen.

Pancreas: The pancreas is normal in size and the pancreatic duct is
not dilated.

Spleen: The spleen contains several calcified granulomas consistent
with prior granulomatous disease.

Adrenals/Urinary Tract: The adrenal glands are stable with a tiny
left adrenal adenoma unchanged. The kidneys enhance with no mass or
calculus. On delayed images the pelvocaliceal systems are
unremarkable. The ureters are normal in caliber. The urinary bladder
is not well distended but no abnormality is seen

Stomach/Bowel: The stomach is not well distended but no abnormality
is evident. No small bowel distention is seen. There are
rectosigmoid colon diverticula present but no diverticulitis is
noted. The terminal ileum is unremarkable. No inflammatory process
is seen within the right lower quadrant.

Vascular/Lymphatic: Moderate abdominal aortic atherosclerosis is
present. No adenopathy is seen with small mesenteric lymph nodes
appearing stable.

Reproductive: The prostate gland is within normal limits.

Other: None.

Musculoskeletal: There is little change in anterolisthesis of L5 on
S1 by approximately 6 mm with degenerative disc disease at L5-S1 and
L2-3. There is degenerative change of the facet joints of the lower
lumbar spine.
IMPRESSION: 1. No inflammatory process is seen within the right lower quadrant.
2. Diffuse fatty infiltration of the liver.
3. Stable anterolisthesis of L5 on S1 measuring 6 mm. Degenerative
disc disease at L5-S1 and L2-3.
4. Probable linear scarring or atelectasis primarily in the left
lower lobe and lingula.
5. Stable splenic granulomas from prior granulomatous disease.

## 2021-03-15 ENCOUNTER — Encounter: Payer: Self-pay | Admitting: Intensive Care

## 2021-03-15 ENCOUNTER — Emergency Department: Payer: BC Managed Care – PPO

## 2021-03-15 ENCOUNTER — Other Ambulatory Visit: Payer: Self-pay

## 2021-03-15 ENCOUNTER — Emergency Department
Admission: EM | Admit: 2021-03-15 | Discharge: 2021-03-15 | Disposition: A | Discharge status: Home / Self Care | Payer: BC Managed Care – PPO | Attending: Emergency Medicine | Admitting: Emergency Medicine

## 2021-03-15 DIAGNOSIS — J189 Pneumonia, unspecified organism: Secondary | ICD-10-CM

## 2021-03-15 DIAGNOSIS — N3000 Acute cystitis without hematuria: Secondary | ICD-10-CM | POA: Insufficient documentation

## 2021-03-15 DIAGNOSIS — Z79899 Other long term (current) drug therapy: Secondary | ICD-10-CM | POA: Diagnosis not present

## 2021-03-15 DIAGNOSIS — I10 Essential (primary) hypertension: Secondary | ICD-10-CM | POA: Diagnosis not present

## 2021-03-15 DIAGNOSIS — J181 Lobar pneumonia, unspecified organism: Secondary | ICD-10-CM | POA: Insufficient documentation

## 2021-03-15 DIAGNOSIS — Z7951 Long term (current) use of inhaled steroids: Secondary | ICD-10-CM | POA: Diagnosis not present

## 2021-03-15 DIAGNOSIS — Z96651 Presence of right artificial knee joint: Secondary | ICD-10-CM | POA: Diagnosis not present

## 2021-03-15 DIAGNOSIS — Z7982 Long term (current) use of aspirin: Secondary | ICD-10-CM | POA: Diagnosis not present

## 2021-03-15 DIAGNOSIS — R0602 Shortness of breath: Secondary | ICD-10-CM | POA: Diagnosis present

## 2021-03-15 DIAGNOSIS — J441 Chronic obstructive pulmonary disease with (acute) exacerbation: Secondary | ICD-10-CM | POA: Insufficient documentation

## 2021-03-15 DIAGNOSIS — F17210 Nicotine dependence, cigarettes, uncomplicated: Secondary | ICD-10-CM | POA: Diagnosis not present

## 2021-03-15 DIAGNOSIS — R42 Dizziness and giddiness: Secondary | ICD-10-CM | POA: Insufficient documentation

## 2021-03-15 LAB — BASIC METABOLIC PANEL
Anion gap: 10 (ref 5–15)
BUN: 11 mg/dL (ref 8–23)
CO2: 27 mmol/L (ref 22–32)
Calcium: 8.8 mg/dL — ABNORMAL LOW (ref 8.9–10.3)
Chloride: 97 mmol/L — ABNORMAL LOW (ref 98–111)
Creatinine, Ser: 1 mg/dL (ref 0.61–1.24)
GFR, Estimated: >60 mL/min (ref >60)
Glucose, Bld: 134 mg/dL — ABNORMAL HIGH (ref 70–99)
Potassium: 3.4 mmol/L — ABNORMAL LOW (ref 3.5–5.1)
Sodium: 134 mmol/L — ABNORMAL LOW (ref 135–145)

## 2021-03-15 LAB — URINALYSIS, COMPLETE (UACMP) WITH MICROSCOPIC
Bilirubin Urine: NEGATIVE
Glucose, UA: NEGATIVE mg/dL
Ketones, ur: NEGATIVE mg/dL
Nitrite: POSITIVE — AB
Protein, ur: NEGATIVE mg/dL
Specific Gravity, Urine: 1.013 (ref 1.005–1.030)
Squamous Epithelial / HPF: NONE SEEN (ref 0–5)
WBC, UA: >50 WBC/hpf — ABNORMAL HIGH (ref 0–5)
pH: 6 (ref 5.0–8.0)

## 2021-03-15 LAB — LACTIC ACID, PLASMA: Lactic Acid, Venous: 1 mmol/L (ref 0.5–1.9)

## 2021-03-15 LAB — CBC
HCT: 47.2 % (ref 39.0–52.0)
Hemoglobin: 16.6 g/dL (ref 13.0–17.0)
MCH: 29 pg (ref 26.0–34.0)
MCHC: 35.2 g/dL (ref 30.0–36.0)
MCV: 82.5 fL (ref 80.0–100.0)
Platelets: 232 10*3/uL (ref 150–400)
RBC: 5.72 MIL/uL (ref 4.22–5.81)
RDW: 13.6 % (ref 11.5–15.5)
WBC: 17 10*3/uL — ABNORMAL HIGH (ref 4.0–10.5)
nRBC: 0 % (ref 0.0–0.2)

## 2021-03-15 MED ORDER — METHYLPREDNISOLONE SODIUM SUCC 125 MG IJ SOLR
125.0000 mg | Freq: Once | INTRAMUSCULAR | Status: AC
  Administered 2021-03-15: 125 mg via INTRAVENOUS
  Filled 2021-03-15: qty 2

## 2021-03-15 MED ORDER — DOXYCYCLINE HYCLATE 100 MG PO TABS: 100.0000 mg | ORAL_TABLET | Freq: Two times a day (BID) | ORAL | 0 refills | Status: AC

## 2021-03-15 MED ORDER — IPRATROPIUM-ALBUTEROL 0.5-2.5 (3) MG/3ML IN SOLN
6.0000 mL | Freq: Once | RESPIRATORY_TRACT | Status: AC
  Administered 2021-03-15: 6 mL via RESPIRATORY_TRACT
  Filled 2021-03-15: qty 3

## 2021-03-15 MED ORDER — SODIUM CHLORIDE 0.9 % IV SOLN
500.0000 mg | Freq: Once | INTRAVENOUS | Status: AC
  Administered 2021-03-15: 500 mg via INTRAVENOUS
  Filled 2021-03-15: qty 500

## 2021-03-15 MED ORDER — ALBUTEROL SULFATE HFA 108 (90 BASE) MCG/ACT IN AERS: 2.0000 | INHALATION_SPRAY | Freq: Four times a day (QID) | RESPIRATORY_TRACT | 2 refills | Status: AC | PRN

## 2021-03-15 MED ORDER — SODIUM CHLORIDE 0.9 % IV SOLN
1.0000 g | Freq: Once | INTRAVENOUS | Status: AC
  Administered 2021-03-15: 1 g via INTRAVENOUS
  Filled 2021-03-15: qty 10

## 2021-03-15 MED ORDER — PREDNISONE 50 MG PO TABS: 50.0000 mg | ORAL_TABLET | Freq: Every day | ORAL | 0 refills | Status: AC

## 2021-03-15 MED ORDER — CEPHALEXIN 500 MG PO CAPS: 500.0000 mg | ORAL_CAPSULE | Freq: Four times a day (QID) | ORAL | 0 refills | Status: AC

## 2021-03-15 NOTE — ED Notes (Signed)
Patient discharged to home per MD order. Patient in stable condition, and deemed medically cleared by ED provider for discharge. Discharge instructions reviewed with patient/family using "Teach Back"; verbalized understanding of medication education and administration, and information about follow-up care. Denies further concerns. ° °

## 2021-03-15 NOTE — Discharge Instructions (Addendum)
You are being discharged with 4 prescriptions: Due to antibiotics, Keflex antibiotic to take 4 times daily for the next 5 days to treat a urinary tract infection. Doxycycline antibiotic to take twice daily for the next 5 days to cover for pneumonia  Prednisone steroids to take once daily, starting tomorrow, to help reduce inflammation in the lungs.  Albuterol rescue inhaler.  Please use this in addition to your Pulmicort.  Use the albuterol every 4-6 hours, up to 2 puffs at a time, to help open up your lungs  Think about quitting smoking.  This is all likely due to your smoking.  If you develop any further worsening symptoms despite these measures, please return to the ED.

## 2021-03-15 NOTE — ED Provider Notes (Signed)
[facility] Emergency Department Provider Note ____________________________________________   Event Date/Time   First MD Initiated Contact with Patient 03/15/21 1852     (approximate)  I have reviewed the triage vital signs and the nursing notes.  HISTORY  Chief Complaint Dizziness and Fever   HPI [name]. is a 75 y.o. malewho presents to the ED for evaluation of dizziness and fever.   Chart review indicates history of HTN, HLD, obesity, COPD and atrial flutter.  Paroxysmal flutter on metoprolol without anticoagulation.  Patient presents to the ED, accompanied by his wife, for evaluation of presyncopal dizziness, low-grade fevers, shortness of breath, increased sputum production and new urinary incontinence/urgency throughout the day today.  He reports all of the symptoms have developed throughout the day today and he was all right yesterday.  Continues to smoke 1 PPD.  He reports feeling slightly short of breath, reports cough with increased sputum production that is thick and yellow.  Further reports presyncopal lightheaded dizziness without syncope.  Denies fall or trauma.  Reports he sometimes has difficulty making it to the bathroom to void in time, but reports feeling significantly worse today with regards to lower/suprapubic abdominal pain, increased urgency and incontinence, as well as "some" dysuria.  Denies flank pain or back pain that is new.   Past Medical History:  Diagnosis Date   Arthritis    COPD (chronic obstructive pulmonary disease) ([place])    History of atrial flutter    History of colon polyps    Hypercholesterolemia    Hypertension    Non-cardiac chest pain    Obesity    Sleep apnea     Patient Active Problem List   Diagnosis Date Noted   Primary localized osteoarthritis of right knee 05/22/2016    Past Surgical History:  Procedure Laterality Date   ANTRAL WINDOW     COLONOSCOPY N/A 02/18/2015   Procedure: COLONOSCOPY;   Surgeon: Hulen Luster, MD;  Location: [facility] ENDOSCOPY;  Service: Gastroenterology;  Laterality: N/A;   COLONOSCOPY WITH PROPOFOL N/A 02/25/2018   Procedure: COLONOSCOPY WITH PROPOFOL;  Surgeon: Toledo, Benay Pike, MD;  Location: [facility] ENDOSCOPY;  Service: Gastroenterology;  Laterality: N/A;   FUNCTIONAL ENDOSCOPIC SINUS SURGERY     HERNIA REPAIR     JOINT REPLACEMENT Right    KNEE ARTHROSCOPY     TONSILLECTOMY     TOTAL KNEE ARTHROPLASTY Right 05/22/2016   Procedure: TOTAL KNEE ARTHROPLASTY;  Surgeon: Hessie Knows, MD;  Location: [facility] ORS;  Service: Orthopedics;  Laterality: Right;   UMBILICAL HERNIA REPAIR      Prior to Admission medications   Medication Sig Start Date End Date Taking? Authorizing Provider  albuterol (VENTOLIN HFA) 108 (90 Base) MCG/ACT inhaler Inhale 2 puffs into the lungs every 6 (six) hours as needed for wheezing or shortness of breath. 03/15/21  Yes Vladimir Crofts, MD  cephALEXin (KEFLEX) 500 MG capsule Take 1 capsule (500 mg total) by mouth 4 (four) times daily for 5 days. 03/15/21 03/20/21 Yes Vladimir Crofts, MD  doxycycline (VIBRA-TABS) 100 MG tablet Take 1 tablet (100 mg total) by mouth 2 (two) times daily for 5 days. 03/15/21 03/20/21 Yes Vladimir Crofts, MD  predniSONE (DELTASONE) 50 MG tablet Take 1 tablet (50 mg total) by mouth daily with breakfast for 4 days. 03/15/21 03/19/21 Yes Vladimir Crofts, MD  aspirin 81 MG chewable tablet Chew 81 mg by mouth daily.    [provider]  budesonide-formoterol (SYMBICORT) 160-4.5 MCG/ACT inhaler Inhale 2 puffs  into the lungs 2 (two) times daily.    [provider]  enoxaparin (LOVENOX) 40 MG/0.4ML injection Inject 0.4 mLs (40 mg total) into the skin daily. 05/24/16   Duanne Guess, PA-C  Fish Oil OIL Take 2 capsules by mouth daily. 500 mg    [provider]  fluticasone (FLONASE) 50 MCG/ACT nasal spray Place 1 spray into both nostrils daily.    [provider]  glucosamine-chondroitin 500-400 MG tablet Take 2  tablets by mouth daily.    [provider]  ibuprofen (ADVIL,MOTRIN) 200 MG tablet Take 400 mg by mouth every 6 (six) hours as needed.    [provider]  metoprolol succinate (TOPROL-XL) 100 MG 24 hr tablet Take 1 tablet by mouth daily. 01/17/15   [provider]  OMEGA-3 FATTY ACIDS PO Take 1,000 mg by mouth 2 (two) times daily.    [provider]  oxyCODONE (OXY IR/ROXICODONE) 5 MG immediate release tablet Take 1-2 tablets (5-10 mg total) by mouth every 3 (three) hours as needed for breakthrough pain. 05/24/16   Duanne Guess, PA-C  pravastatin (PRAVACHOL) 40 MG tablet Take 40 mg by mouth at bedtime.  01/17/15   [provider]  traMADol (ULTRAM) 50 MG tablet Take 1-2 tablets (50-100 mg total) by mouth every 6 (six) hours. 05/24/16   Duanne Guess, PA-C    Allergies Celebrex [celecoxib]  History reviewed. No pertinent family history.  Social History Social History   Tobacco Use   Smoking status: Every Day    Packs/day: 1.00    Years: 50.00    Pack years: 50.00    Types: Cigarettes   Smokeless tobacco: Never  Vaping Use   Vaping Use: Never used  Substance Use Topics   Alcohol use: No   Drug use: No    Review of Systems  Constitutional: Positive for fever/chills Eyes: No visual changes. ENT: No sore throat. Cardiovascular: Denies chest pain. Respiratory: Positive for productive cough and shortness of breath. Gastrointestinal: Positive for lower abdominal pain no nausea, no vomiting.  No diarrhea.  No constipation. Genitourinary: Positive for dysuria. Musculoskeletal: Negative for back pain. Skin: Negative for rash. Neurological: Negative for headaches, focal weakness or numbness.  ____________________________________________   PHYSICAL EXAM:  VITAL SIGNS: Vitals:   03/15/21 1726 03/15/21 2100  BP: 124/87 120/77  Pulse: 86 84  Resp: (!) 22 20  Temp: 98.9 F (37.2 C)   SpO2: 93% 97%     Constitutional: Alert and  oriented.  Appears uncomfortable without distress.  Sitting upright on the side of the bed.  Speaking in full sentences. Eyes: Conjunctivae are normal. PERRL. EOMI. Head: Atraumatic. Nose: No congestion/rhinnorhea. Mouth/Throat: Mucous membranes are moist.  Oropharynx non-erythematous. Neck: No stridor. No cervical spine tenderness to palpation. Cardiovascular: Normal rate, regular rhythm. Grossly normal heart sounds.  Good peripheral circulation. Respiratory: Tachypneic to the mid 20s, no retractions.  Diffuse expiratory wheezes with poor air movement throughout. Gastrointestinal: Mild suprapubic abdominal tenderness without peritoneal features.  Abdomen is otherwise benign. Soft , nondistended. No CVA tenderness. Musculoskeletal: No lower extremity tenderness nor edema.  No joint effusions. No signs of acute trauma. Neurologic:  Normal speech and language. No gross focal neurologic deficits are appreciated. No gait instability noted. Skin:  Skin is warm, dry and intact. No rash noted. Psychiatric: Mood and affect are normal. Speech and behavior are normal.  ____________________________________________   LABS (all labs ordered are listed, but only abnormal results are displayed)  Labs Reviewed  BASIC METABOLIC PANEL - Abnormal; Notable for the following components:      Result Value   Sodium 134 (*)    Potassium 3.4 (*)    Chloride 97 (*)    Glucose, Bld 134 (*)    Calcium 8.8 (*)    All other components within normal limits  CBC - Abnormal; Notable for the following components:   WBC 17.0 (*)    All other components within normal limits  URINALYSIS, COMPLETE (UACMP) WITH MICROSCOPIC - Abnormal; Notable for the following components:   Color, Urine YELLOW (*)    APPearance HAZY (*)    Hgb urine dipstick MODERATE (*)    Nitrite POSITIVE (*)    Leukocytes,Ua LARGE (*)    WBC, UA >50 (*)    Bacteria, UA FEW (*)    All other components within normal limits  CULTURE, BLOOD  (ROUTINE X 2)  CULTURE, BLOOD (ROUTINE X 2)  URINE CULTURE  LACTIC ACID, PLASMA  LACTIC ACID, PLASMA   ____________________________________________  12 Lead EKG  Sinus rhythm rate 84 bpm.  Normal axis and intervals.  No evidence of acute ischemia. ____________________________________________  RADIOLOGY  ED MD interpretation: 1 view CXR reviewed by me with streaky opacities to the left base.  Official radiology report(s): DG Chest 1 View  Result Date: 03/15/2021 CLINICAL DATA:  Wheezing EXAM: CHEST  1 VIEW COMPARISON:  06/05/2010, CT 09/08/2014 FINDINGS: Emphysematous disease with chronic bronchitic changes. Streaky atelectasis versus mild infiltrate at the lingula and left base. Stable cardiomediastinal silhouette. No pneumothorax. IMPRESSION: Emphysematous disease and chronic bronchitic changes. Streaky atelectasis or minimal pneumonia at the lingula and left base Electronically Signed   By: Donavan Foil M.D.   On: 03/15/2021 18:33    ____________________________________________   PROCEDURES and INTERVENTIONS  Procedure(s) performed (including Critical Care):  .1-3 Lead EKG Interpretation  Date/Time: 03/15/2021 8:31 PM Performed by: Vladimir Crofts, MD Authorized by: Vladimir Crofts, MD     Interpretation: normal     ECG rate:  80   ECG rate assessment: normal     Rhythm: sinus rhythm     Ectopy: none     Conduction: normal    Medications  azithromycin (ZITHROMAX) 500 mg in sodium chloride 0.9 % 250 mL IVPB (500 mg Intravenous New Bag/Given 03/15/21 2056)  cefTRIAXone (ROCEPHIN) 1 g in sodium chloride 0.9 % 100 mL IVPB (0 g Intravenous Stopped 03/15/21 2048)  methylPREDNISolone sodium succinate (SOLU-MEDROL) 125 mg/2 mL injection 125 mg (125 mg Intravenous Given 03/15/21 2057)  ipratropium-albuterol (DUONEB) 0.5-2.5 (3) MG/3ML nebulizer solution 6 mL (6 mLs Nebulization Given 03/15/21 2057)    ____________________________________________   MDM / ED COURSE   75 year old  male presents to the ED with presyncopal dizziness and low-grade fevers at home, with evidence of COPD exacerbation with increased sputum production/CAP, as well as acute cystitis, ultimately amenable to outpatient management.  Normal vitals on room air here.  Blood work with leukocytosis, but no sepsis criteria.  CXR with streaky infiltrate to his left base, and in conjunction with his increased sputum production, likely an infectious etiology of his COPD exacerbation.  He was started on steroids and antibiotics for this.  Provided duo nebs with significant improvement of his clinical picture and airflow on auscultation.  Further has evidence of acute cystitis without evidence of pyelonephritis.  I discussed with the patient in observation admission, but he reports he is more eager to go home.  We will discharge with Keflex to treat acute cystitis, doxycycline to  cover COPD exacerbation and CAP, prednisone burst for his COPD exacerbation and an albuterol inhaler to have as needed at home.  We discussed return precautions for the ED and patient is stable for outpatient management.   Clinical Course as of 03/15/21 2155  Wed Mar 15, 2021  2151 Reassessed.  Patient looks clinically much better to me shared decision making about observation admission versus discharge.  He reports he is very eager to go home and is requesting discharge.  We discussed prescriptions for antibiotics to cover UTI and CAP.  We discussed steroids.  We discussed albuterol.  We discussed return precautions for the ED [DS]    Clinical Course User Index [DS] Vladimir Crofts, MD    ____________________________________________   FINAL CLINICAL IMPRESSION(S) / ED DIAGNOSES  Final diagnoses:  COPD with acute exacerbation ([place])  Acute cystitis without hematuria  Community acquired pneumonia of left lower lobe of lung  Cigarette smoker     ED Discharge Orders          Ordered    doxycycline (VIBRA-TABS) 100 MG tablet  2 times  daily        03/15/21 2153    cephALEXin (KEFLEX) 500 MG capsule  4 times daily        03/15/21 2153    predniSONE (DELTASONE) 50 MG tablet  Daily with breakfast        03/15/21 2153    albuterol (VENTOLIN HFA) 108 (90 Base) MCG/ACT inhaler  Every 6 hours PRN        03/15/21 2153             Kairah Leoni   Note:  This document was prepared using Dragon voice recognition software and may include unintentional dictation errors.    Vladimir Crofts, MD 03/15/21 2157

## 2021-03-15 NOTE — ED Triage Notes (Signed)
Patient c/o dizziness and fever (100.5) that started this AM. Denies HX of dizziness. Also reports when the urge hits him to pee, he cannot hold urine until he gets to bathroom. A&O x4. Reports going to work today and symptoms got worse throughout day

## 2021-03-18 LAB — URINE CULTURE: Culture: >=100000 — AB

## 2021-03-20 LAB — CULTURE, BLOOD (ROUTINE X 2)
Culture: NO GROWTH
Culture: NO GROWTH
Special Requests: ADEQUATE
Special Requests: ADEQUATE

## 2022-06-28 ENCOUNTER — Other Ambulatory Visit: Payer: Self-pay | Admitting: Internal Medicine

## 2022-06-28 DIAGNOSIS — Z0000 Encounter for general adult medical examination without abnormal findings: Secondary | ICD-10-CM

## 2022-06-28 DIAGNOSIS — R194 Change in bowel habit: Secondary | ICD-10-CM

## 2022-07-26 ENCOUNTER — Ambulatory Visit
Admission: RE | Admit: 2022-07-26 | Discharge: 2022-07-26 | Disposition: A | Discharge status: Home / Self Care | Payer: BC Managed Care – PPO | Source: Ambulatory Visit | Attending: Internal Medicine | Admitting: Internal Medicine

## 2022-07-26 DIAGNOSIS — R194 Change in bowel habit: Secondary | ICD-10-CM

## 2022-07-26 DIAGNOSIS — Z0000 Encounter for general adult medical examination without abnormal findings: Secondary | ICD-10-CM

## 2022-07-26 MED ORDER — IOPAMIDOL (ISOVUE-300) INJECTION 61%
100.0000 mL | Freq: Once | INTRAVENOUS | Status: AC | PRN
  Administered 2022-07-26: 100 mL via INTRAVENOUS

## 2022-11-23 ENCOUNTER — Ambulatory Visit (INDEPENDENT_AMBULATORY_CARE_PROVIDER_SITE_OTHER): Payer: BC Managed Care – PPO | Admitting: Vascular Surgery

## 2022-11-23 ENCOUNTER — Encounter (INDEPENDENT_AMBULATORY_CARE_PROVIDER_SITE_OTHER): Payer: BC Managed Care – PPO

## 2023-02-13 ENCOUNTER — Other Ambulatory Visit: Payer: Self-pay | Admitting: Physician Assistant

## 2023-02-13 DIAGNOSIS — R1032 Left lower quadrant pain: Secondary | ICD-10-CM

## 2023-02-13 DIAGNOSIS — R112 Nausea with vomiting, unspecified: Secondary | ICD-10-CM

## 2023-02-18 ENCOUNTER — Ambulatory Visit
Admission: RE | Admit: 2023-02-18 | Discharge: 2023-02-18 | Disposition: A | Discharge status: Home / Self Care | Payer: BC Managed Care – PPO | Source: Ambulatory Visit | Attending: Physician Assistant | Admitting: Physician Assistant

## 2023-02-18 DIAGNOSIS — R1032 Left lower quadrant pain: Secondary | ICD-10-CM | POA: Insufficient documentation

## 2023-02-18 DIAGNOSIS — R112 Nausea with vomiting, unspecified: Secondary | ICD-10-CM

## 2023-02-18 MED ORDER — IOHEXOL 300 MG/ML  SOLN
100.0000 mL | Freq: Once | INTRAMUSCULAR | Status: AC | PRN
  Administered 2023-02-18: 100 mL via INTRAVENOUS

## 2023-02-26 ENCOUNTER — Encounter: Payer: Self-pay | Admitting: Internal Medicine

## 2023-02-27 ENCOUNTER — Ambulatory Visit: Payer: BC Managed Care – PPO | Admitting: Anesthesiology

## 2023-02-27 ENCOUNTER — Ambulatory Visit
Admission: RE | Admit: 2023-02-27 | Discharge: 2023-02-27 | Disposition: A | Discharge status: Home / Self Care | Payer: BC Managed Care – PPO | Attending: Internal Medicine | Admitting: Internal Medicine

## 2023-02-27 ENCOUNTER — Encounter
Admission: RE | Disposition: A | Discharge status: Home / Self Care | Payer: Self-pay | Source: Home / Self Care | Attending: Internal Medicine

## 2023-02-27 DIAGNOSIS — K760 Fatty (change of) liver, not elsewhere classified: Secondary | ICD-10-CM | POA: Insufficient documentation

## 2023-02-27 DIAGNOSIS — E7800 Pure hypercholesterolemia, unspecified: Secondary | ICD-10-CM | POA: Diagnosis not present

## 2023-02-27 DIAGNOSIS — Z6841 Body Mass Index (BMI) 40.0 and over, adult: Secondary | ICD-10-CM | POA: Diagnosis not present

## 2023-02-27 DIAGNOSIS — K2970 Gastritis, unspecified, without bleeding: Secondary | ICD-10-CM | POA: Diagnosis not present

## 2023-02-27 DIAGNOSIS — J449 Chronic obstructive pulmonary disease, unspecified: Secondary | ICD-10-CM | POA: Diagnosis not present

## 2023-02-27 DIAGNOSIS — Z86010 Personal history of colonic polyps: Secondary | ICD-10-CM | POA: Diagnosis present

## 2023-02-27 DIAGNOSIS — Z7951 Long term (current) use of inhaled steroids: Secondary | ICD-10-CM | POA: Insufficient documentation

## 2023-02-27 DIAGNOSIS — Z79899 Other long term (current) drug therapy: Secondary | ICD-10-CM | POA: Diagnosis not present

## 2023-02-27 DIAGNOSIS — K6389 Other specified diseases of intestine: Secondary | ICD-10-CM | POA: Diagnosis not present

## 2023-02-27 DIAGNOSIS — K640 First degree hemorrhoids: Secondary | ICD-10-CM | POA: Insufficient documentation

## 2023-02-27 DIAGNOSIS — I700 Atherosclerosis of aorta: Secondary | ICD-10-CM | POA: Diagnosis not present

## 2023-02-27 DIAGNOSIS — K219 Gastro-esophageal reflux disease without esophagitis: Secondary | ICD-10-CM | POA: Diagnosis not present

## 2023-02-27 DIAGNOSIS — R1012 Left upper quadrant pain: Secondary | ICD-10-CM | POA: Diagnosis not present

## 2023-02-27 DIAGNOSIS — E669 Obesity, unspecified: Secondary | ICD-10-CM | POA: Insufficient documentation

## 2023-02-27 DIAGNOSIS — G4730 Sleep apnea, unspecified: Secondary | ICD-10-CM | POA: Diagnosis not present

## 2023-02-27 DIAGNOSIS — I10 Essential (primary) hypertension: Secondary | ICD-10-CM | POA: Diagnosis not present

## 2023-02-27 DIAGNOSIS — Z1211 Encounter for screening for malignant neoplasm of colon: Secondary | ICD-10-CM | POA: Diagnosis present

## 2023-02-27 DIAGNOSIS — K5730 Diverticulosis of large intestine without perforation or abscess without bleeding: Secondary | ICD-10-CM | POA: Insufficient documentation

## 2023-02-27 HISTORY — PX: ESOPHAGOGASTRODUODENOSCOPY: SHX5428

## 2023-02-27 HISTORY — PX: COLONOSCOPY: SHX5424

## 2023-02-27 SURGERY — COLONOSCOPY
Anesthesia: General

## 2023-02-27 MED ORDER — SODIUM CHLORIDE 0.9 % IV SOLN: INTRAVENOUS | Status: DC | PRN

## 2023-02-27 MED ORDER — GLYCOPYRROLATE 0.2 MG/ML IJ SOLN
INTRAMUSCULAR | Status: DC | PRN
  Administered 2023-02-27: .2 mg via INTRAVENOUS

## 2023-02-27 MED ORDER — ONDANSETRON HCL 4 MG/2ML IJ SOLN
INTRAMUSCULAR | Status: DC | PRN
  Administered 2023-02-27: 4 mg via INTRAVENOUS

## 2023-02-27 MED ORDER — SODIUM CHLORIDE 0.9 % IV SOLN
INTRAVENOUS | Status: DC
  Administered 2023-02-27: 20 mL/h via INTRAVENOUS

## 2023-02-27 MED ORDER — PROPOFOL 500 MG/50ML IV EMUL
INTRAVENOUS | Status: DC | PRN
  Administered 2023-02-27: 100 ug/kg/min via INTRAVENOUS
  Administered 2023-02-27: 50 mg via INTRAVENOUS
  Administered 2023-02-27: 40 mg via INTRAVENOUS

## 2023-02-27 MED ORDER — ONDANSETRON HCL 4 MG/2ML IJ SOLN
INTRAMUSCULAR | Status: AC
  Filled 2023-02-27: qty 2

## 2023-02-27 MED ORDER — DEXMEDETOMIDINE HCL IN NACL 200 MCG/50ML IV SOLN
INTRAVENOUS | Status: DC | PRN
  Administered 2023-02-27: 4 ug via INTRAVENOUS
  Administered 2023-02-27 (×2): 8 ug via INTRAVENOUS

## 2023-02-27 NOTE — Op Note (Signed)
[facility] Gastroenterology Patient Name: Daniel Boyer Procedure Date: 02/27/2023 11:02 AM MRN: [id number] Account #: [id number] Date of Birth: 06-Feb-1946 Admit Type: Outpatient Age: 77 Room: [facility] ENDO ROOM 2 Gender: Male Note Status: Finalized Instrument Name: Laurette Schimke [id number] Procedure:             Upper GI endoscopy Indications:           Abdominal pain in the left upper quadrant, Nausea Providers:             Boykin Nearing. Norma Fredrickson MD, MD Referring MD:          Duane Lope. Judithann Sheen, MD (Referring MD) Medicines:             Propofol per Anesthesia Complications:         No immediate complications. Procedure:             Pre-Anesthesia Assessment:                        - The risks and benefits of the procedure and the                         sedation options and risks were discussed with the                         patient. All questions were answered and informed                         consent was obtained.                        - Patient identification and proposed procedure were                         verified prior to the procedure by the nurse. The                         procedure was verified in the procedure room.                        - ASA Grade Assessment: III - A patient with severe                         systemic disease.                        - After reviewing the risks and benefits, the patient                         was deemed in satisfactory condition to undergo the                         procedure.                        After obtaining informed consent, the endoscope was                         passed under direct vision. Throughout the procedure,  the patient's blood pressure, pulse, and oxygen                         saturations were monitored continuously. The Endoscope                         was introduced through the mouth, and advanced to the                         third part of duodenum. The upper GI  endoscopy was                         accomplished without difficulty. The patient tolerated                         the procedure well. Findings:      The esophagus was normal.      Patchy minimal inflammation characterized by erythema was found in the       gastric body. Biopsies were taken with a cold forceps for Helicobacter       pylori testing.      The exam of the stomach was otherwise normal.      The examined duodenum was normal. Impression:            - Normal esophagus.                        - Gastritis. Biopsied.                        - Normal examined duodenum. Recommendation:        - Await pathology results.                        - Proceed with colonoscopy Procedure Code(s):     --- Professional ---                        [phone], Esophagogastroduodenoscopy, flexible,                         transoral; with biopsy, single or multiple Diagnosis Code(s):     --- Professional ---                        R11.0, Nausea                        R10.12, Left upper quadrant pain                        K29.70, Gastritis, unspecified, without bleeding CPT copyright 2022 American Medical Association. All rights reserved. The codes documented in this report are preliminary and upon coder review may  be revised to meet current compliance requirements. Stanton Kidney MD, MD 02/27/2023 11:12:38 AM This report has been signed electronically. Number of Addenda: 0 Note Initiated On: 02/27/2023 11:02 AM Estimated Blood Loss:  Estimated blood loss: none.      [facility]

## 2023-02-27 NOTE — H&P (Signed)
Outpatient short stay form Pre-procedure 02/27/2023 10:51 AM Daniel Boyer, M.D.  Primary Physician: Daniel Boyer, M.D.  Reason for visit:  Nausea, vomiting, Hx of adenomatous colon polyps, LLQ pain, dyspepsia  History of present illness:  Patient was seen as new patient by Dr. Norma Boyer back in January 2024. He is scheduled for surveillance colonoscopy. He saw Daniel Boyer in clinic this week for concerns of non-intractable nausea and vomiting, LLQ abdominal pain, and dyspepsia. CT Scan ordered but not performed yet.  Daniel Boyer is a pleasant 77 year old male presenting today for history of change in bowel habits with chronic mild constipation. Patient's appoint was made several months ago and since that time he has not had as much constipation. He has roughly 1 stool per day with slight straining but soft consistency. There is no fecal urgency or incontinence. There is no rectal bleeding. He complains of excessive flatulence and bloating. He has a chronic right lower quadrant/right flank discomfort that lasts for 4 to 5 minutes without any known aggravating or alleviating factors. Having a bowel movement does not seem to make his pain go away although it is very short-lived. Patient has history of GERD that is controlled with daily acid suppression. He denies any dysphagia, nausea, vomiting, hematemesis or melena.  Colonoscopy - TKT -02/25/18 - Left sided diverticulosis, internal hemorroids. 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  CT Abdomen/:Pelvis - 07/26/22:  IMPRESSION: 1. No acute or significant finding. Mild diverticulosis without evidence of diverticulitis. The amount of fecal matter present is subjectively within normal limits. 2. Mild fatty change of the liver. 3. Aortic atherosclerosis. 4. Chronic scarring at the lung bases. 5. Chronic lumbar degenerative changes including degenerative anterolisthesis at L5-S1.     Current Facility-Administered Medications:    0.9 %   sodium chloride infusion, , Intravenous, Continuous, Alsey, Boykin Nearing, MD, Last Rate: 20 mL/hr at 02/27/23 0951, 20 mL/hr at 02/27/23 0951  Medications Prior to Admission  Medication Sig Dispense Refill Last Dose   albuterol (VENTOLIN HFA) 108 (90 Base) MCG/ACT inhaler Inhale 2 puffs into the lungs every 6 (six) hours as needed for wheezing or shortness of breath. 8 g 2 02/26/2023   aspirin 81 MG chewable tablet Chew 81 mg by mouth daily.   02/26/2023   budesonide-formoterol (SYMBICORT) 160-4.5 MCG/ACT inhaler Inhale 2 puffs into the lungs 2 (two) times daily.   Past Week   enoxaparin (LOVENOX) 40 MG/0.4ML injection Inject 0.4 mLs (40 mg total) into the skin daily. 14 Syringe 0 02/26/2023   Fish Oil OIL Take 2 capsules by mouth daily. 500 mg   Past Week   glucosamine-chondroitin 500-400 MG tablet Take 2 tablets by mouth daily.   02/26/2023   ibuprofen (ADVIL,MOTRIN) 200 MG tablet Take 400 mg by mouth every 6 (six) hours as needed.   Past Week   metoprolol succinate (TOPROL-XL) 100 MG 24 hr tablet Take 1 tablet by mouth daily.  1 02/27/2023 at 0530   OMEGA-3 FATTY ACIDS PO Take 1,000 mg by mouth 2 (two) times daily.   Past Week   oxyCODONE (OXY IR/ROXICODONE) 5 MG immediate release tablet Take 1-2 tablets (5-10 mg total) by mouth every 3 (three) hours as needed for breakthrough pain. 60 tablet 0 02/26/2023   pravastatin (PRAVACHOL) 40 MG tablet Take 40 mg by mouth at bedtime.   0 02/26/2023   traMADol (ULTRAM) 50 MG tablet Take 1-2 tablets (50-100 mg total) by mouth every 6 (six) hours. 60 tablet 0 02/26/2023   fluticasone (  FLONASE) 50 MCG/ACT nasal spray Place 1 spray into both nostrils daily.        Allergies  Allergen Reactions   Celebrex [Celecoxib]      Past Medical History:  Diagnosis Date   Arthritis    COPD (chronic obstructive pulmonary disease) ([facility])    History of atrial flutter    History of colon polyps    Hypercholesterolemia    Hypertension    Non-cardiac chest pain     Obesity    Sleep apnea     Review of systems:  Otherwise negative.    Physical Exam  Gen: Alert, oriented. Appears stated age.  HEENT: /AT. PERRLA. Lungs: CTA, no wheezes. CV: RR nl S1, S2. Abd: soft, benign, no masses. BS+ Ext: No edema. Pulses 2+    Planned procedures: Proceed with EGD and colonoscopy. The patient understands the nature of the planned procedure, indications, risks, alternatives and potential complications including but not limited to bleeding, infection, perforation, damage to internal organs and possible oversedation/side effects from anesthesia. The patient agrees and gives consent to proceed.  Please refer to procedure notes for findings, recommendations and patient disposition/instructions.     Daniel Boyer K. Daniel Boyer, M.D. Gastroenterology 02/27/2023  10:51 AM

## 2023-02-27 NOTE — Transfer of Care (Signed)
Immediate Anesthesia Transfer of Care Note  Patient: Daniel Boyer.  Procedure(s) Performed: COLONOSCOPY ESOPHAGOGASTRODUODENOSCOPY (EGD)  Patient Location: PACU  Anesthesia Type:MAC  Level of Consciousness: awake  Airway & Oxygen Therapy: Patient Spontanous Breathing  Post-op Assessment: Report given to RN and Post -op Vital signs reviewed and stable  Post vital signs: Reviewed  Last Vitals:  Vitals Value Taken Time  BP 95/49 02/27/23 1130  Temp 36.4 C 02/27/23 1128  Pulse 52 02/27/23 1130  Resp 19 02/27/23 1130  SpO2 94 % 02/27/23 1130  Vitals shown include unvalidated device data.  Last Pain:  Vitals:   02/27/23 1128  TempSrc: Temporal  PainSc: 0-No pain         Complications: No notable events documented.

## 2023-02-27 NOTE — Op Note (Signed)
[facility] Gastroenterology Patient Name: Daniel Boyer Procedure Date: 02/27/2023 11:01 AM MRN: [id number] Account #: [id number] Date of Birth: 1946/02/10 Admit Type: Outpatient Age: 77 Room: [facility] ENDO ROOM 2 Gender: Male Note Status: Finalized Instrument Name: Nelda Marseille [id number] Procedure:             Colonoscopy Indications:           High risk colon cancer surveillance: Personal history                         of non-advanced adenoma Providers:             Boykin Nearing. Norma Fredrickson MD, MD Referring MD:          Duane Lope. Judithann Sheen, MD (Referring MD) Medicines:             Propofol per Anesthesia Complications:         No immediate complications. Procedure:             Pre-Anesthesia Assessment:                        - The risks and benefits of the procedure and the                         sedation options and risks were discussed with the                         patient. All questions were answered and informed                         consent was obtained.                        - Patient identification and proposed procedure were                         verified prior to the procedure by the nurse. The                         procedure was verified in the procedure room.                        - ASA Grade Assessment: III - A patient with severe                         systemic disease.                        - After reviewing the risks and benefits, the patient                         was deemed in satisfactory condition to undergo the                         procedure.                        After obtaining informed consent, the colonoscope was                         passed under  direct vision. Throughout the procedure,                         the patient's blood pressure, pulse, and oxygen                         saturations were monitored continuously. The                         Colonoscope was introduced through the anus and                         advanced to  the the cecum, identified by appendiceal                         orifice and ileocecal valve. The colonoscopy was                         performed without difficulty. The patient tolerated                         the procedure well. The quality of the bowel                         preparation was adequate. The ileocecal valve,                         appendiceal orifice, and rectum were photographed. Findings:      The perianal and digital rectal examinations were normal. Pertinent       negatives include normal sphincter tone and no palpable rectal lesions.      Non-bleeding internal hemorrhoids were found during retroflexion. The       hemorrhoids were Grade I (internal hemorrhoids that do not prolapse).      Many medium-mouthed and small-mouthed diverticula were found in the       sigmoid colon. There was no evidence of diverticular bleeding.      There was a small lipoma, 25 mm in diameter, in the ascending colon.       Biopsies were taken with a cold forceps for histology.      The exam was otherwise without abnormality. Impression:            - Non-bleeding internal hemorrhoids.                        - Mild diverticulosis in the sigmoid colon. There was                         no evidence of diverticular bleeding.                        - Small lipoma in the ascending colon. Biopsied.                        - The examination was otherwise normal. Recommendation:        - Await pathology results from EGD, also performed                         today.                        -  Patient has a contact number available for                         emergencies. The signs and symptoms of potential                         delayed complications were discussed with the patient.                         Return to normal activities tomorrow. Written                         discharge instructions were provided to the patient.                        - Resume previous diet.                        -  Continue present medications.                        - Await pathology results.                        - No repeat colonoscopy due to current age (59 years                         or older) and the absence of advanced adenomas.                        - Return to GI office PRN.                        - The findings and recommendations were discussed with                         the patient. Procedure Code(s):     --- Professional ---                        [phone], Colonoscopy, flexible; with biopsy, single or                         multiple Diagnosis Code(s):     --- Professional ---                        K57.30, Diverticulosis of large intestine without                         perforation or abscess without bleeding                        D17.5, Benign lipomatous neoplasm of intra-abdominal                         organs                        K64.0, First degree hemorrhoids                        Z86.010, Personal  history of colonic polyps CPT copyright 2022 [employer]. All rights reserved. The codes documented in this report are preliminary and upon coder review may  be revised to meet current compliance requirements. Stanton Kidney MD, MD 02/27/2023 11:28:14 AM This report has been signed electronically. Number of Addenda: 0 Note Initiated On: 02/27/2023 11:01 AM Scope Withdrawal Time: 0 hours 6 minutes 3 seconds  Total Procedure Duration: 0 hours 8 minutes 28 seconds  Estimated Blood Loss:  Estimated blood loss: none.      [facility]

## 2023-02-27 NOTE — Anesthesia Preprocedure Evaluation (Signed)
Anesthesia Evaluation  Patient identified by MRN, date of birth, ID band Patient awake    Reviewed: Allergy & Precautions, H&P , NPO status , Patient's Chart, lab work & pertinent test results, reviewed documented beta blocker date and time   Airway Mallampati: II   Neck ROM: full    Dental  (+) Poor Dentition   Pulmonary sleep apnea and Continuous Positive Airway Pressure Ventilation , COPD, Current Smoker and Patient abstained from smoking.   Pulmonary exam normal        Cardiovascular Exercise Tolerance: Good hypertension, On Medications negative cardio ROS Normal cardiovascular exam Rhythm:regular Rate:Normal     Neuro/Psych negative neurological ROS  negative psych ROS   GI/Hepatic negative GI ROS, Neg liver ROS,,,  Endo/Other  negative endocrine ROS    Renal/GU negative Renal ROS  negative genitourinary   Musculoskeletal   Abdominal   Peds  Hematology negative hematology ROS (+)   Anesthesia Other Findings Past Medical History: No date: Arthritis No date: COPD (chronic obstructive pulmonary disease) (HCC) No date: History of atrial flutter No date: History of colon polyps No date: Hypercholesterolemia No date: Hypertension No date: Non-cardiac chest pain No date: Obesity No date: Sleep apnea Past Surgical History: No date: ANTRAL WINDOW 02/18/2015: COLONOSCOPY; N/A     Comment:  Procedure: COLONOSCOPY;  Surgeon: Wallace Cullens, MD;                Location: [facility] ENDOSCOPY;  Service: Gastroenterology;                Laterality: N/A; 02/25/2018: COLONOSCOPY WITH PROPOFOL; N/A     Comment:  Procedure: COLONOSCOPY WITH PROPOFOL;  Surgeon: Toledo,               Boykin Nearing, MD;  Location: [facility] ENDOSCOPY;  Service:               Gastroenterology;  Laterality: N/A; No date: FUNCTIONAL ENDOSCOPIC SINUS SURGERY No date: HERNIA REPAIR No date: JOINT REPLACEMENT; Right No date: KNEE ARTHROSCOPY No date:  TONSILLECTOMY 05/22/2016: TOTAL KNEE ARTHROPLASTY; Right     Comment:  Procedure: TOTAL KNEE ARTHROPLASTY;  Surgeon: Kennedy Bucker, MD;  Location: [facility] ORS;  Service: Orthopedics;                Laterality: Right; No date: UMBILICAL HERNIA REPAIR BMI    Body Mass Index: 40.08 kg/m     Reproductive/Obstetrics negative OB ROS                             Anesthesia Physical Anesthesia Plan  ASA: 3  Anesthesia Plan: General   Post-op Pain Management:    Induction:   PONV Risk Score and Plan:   Airway Management Planned:   Additional Equipment:   Intra-op Plan:   Post-operative Plan:   Informed Consent: I have reviewed the patients History and Physical, chart, labs and discussed the procedure including the risks, benefits and alternatives for the proposed anesthesia with the patient or authorized representative who has indicated his/her understanding and acceptance.     Dental Advisory Given  Plan Discussed with: CRNA  Anesthesia Plan Comments:        Anesthesia Quick Evaluation

## 2023-02-27 NOTE — Interval H&P Note (Signed)
History and Physical Interval Note:  02/27/2023 10:55 AM  Daniel Boyer.  has presented today for surgery, with the diagnosis of Hx of adenomatous polyp of colon (Z86.010).  The various methods of treatment have been discussed with the patient and family. After consideration of risks, benefits and other options for treatment, the patient has consented to  Procedure(s): COLONOSCOPY (N/A) ESOPHAGOGASTRODUODENOSCOPY (EGD) (N/A) as a surgical intervention.  The patient's history has been reviewed, patient examined, no change in status, stable for surgery.  I have reviewed the patient's chart and labs.  Questions were answered to the patient's satisfaction.     [place], Daniel Boyer

## 2023-02-27 NOTE — Anesthesia Postprocedure Evaluation (Signed)
Anesthesia Post Note  Patient: Taz Waggle.  Procedure(s) Performed: COLONOSCOPY ESOPHAGOGASTRODUODENOSCOPY (EGD)  Patient location during evaluation: PACU Anesthesia Type: General Level of consciousness: awake and alert Pain management: pain level controlled Vital Signs Assessment: post-procedure vital signs reviewed and stable Respiratory status: spontaneous breathing, nonlabored ventilation, respiratory function stable and patient connected to nasal cannula oxygen Cardiovascular status: blood pressure returned to baseline and stable Postop Assessment: no apparent nausea or vomiting Anesthetic complications: no   No notable events documented.   Last Vitals:  Vitals:   02/27/23 1138 02/27/23 1148  BP: 109/67 117/72  Pulse:    Resp:    Temp:    SpO2: 90% 93%    Last Pain:  Vitals:   02/27/23 1148  TempSrc:   PainSc: 0-No pain                 Yevette Edwards

## 2023-02-28 ENCOUNTER — Encounter: Payer: Self-pay | Admitting: Internal Medicine

## 2023-03-14 ENCOUNTER — Encounter: Payer: Self-pay | Admitting: Physician Assistant

## 2023-03-14 ENCOUNTER — Other Ambulatory Visit: Payer: Self-pay | Admitting: Physician Assistant

## 2023-03-14 DIAGNOSIS — R1084 Generalized abdominal pain: Secondary | ICD-10-CM

## 2023-03-21 ENCOUNTER — Ambulatory Visit
Admission: RE | Admit: 2023-03-21 | Discharge: 2023-03-21 | Disposition: A | Discharge status: Home / Self Care | Payer: BC Managed Care – PPO | Source: Ambulatory Visit | Attending: Physician Assistant | Admitting: Physician Assistant

## 2023-03-21 DIAGNOSIS — R1084 Generalized abdominal pain: Secondary | ICD-10-CM | POA: Insufficient documentation

## 2023-03-21 MED ORDER — IOHEXOL 350 MG/ML SOLN
100.0000 mL | Freq: Once | INTRAVENOUS | Status: AC | PRN
  Administered 2023-03-21: 100 mL via INTRAVENOUS

## 2023-04-29 ENCOUNTER — Encounter (INDEPENDENT_AMBULATORY_CARE_PROVIDER_SITE_OTHER): Payer: Self-pay | Admitting: Vascular Surgery

## 2023-05-03 ENCOUNTER — Encounter (INDEPENDENT_AMBULATORY_CARE_PROVIDER_SITE_OTHER): Payer: BC Managed Care – PPO | Admitting: Vascular Surgery

## 2023-05-07 ENCOUNTER — Ambulatory Visit (INDEPENDENT_AMBULATORY_CARE_PROVIDER_SITE_OTHER): Payer: BC Managed Care – PPO | Admitting: Vascular Surgery

## 2023-05-07 ENCOUNTER — Encounter (INDEPENDENT_AMBULATORY_CARE_PROVIDER_SITE_OTHER): Payer: Self-pay | Admitting: Vascular Surgery

## 2023-05-07 VITALS — BP 110/73 | HR 63 | Resp 18 | Ht 71.0 in | Wt 283.6 lb

## 2023-05-07 DIAGNOSIS — E7800 Pure hypercholesterolemia, unspecified: Secondary | ICD-10-CM | POA: Insufficient documentation

## 2023-05-07 DIAGNOSIS — I10 Essential (primary) hypertension: Secondary | ICD-10-CM

## 2023-05-07 DIAGNOSIS — I7779 Dissection of other artery: Secondary | ICD-10-CM | POA: Insufficient documentation

## 2023-05-07 NOTE — Assessment & Plan Note (Signed)
lipid control important in reducing the progression of atherosclerotic disease. Continue statin therapy  

## 2023-05-07 NOTE — Assessment & Plan Note (Signed)
blood pressure control important in reducing the progression of atherosclerotic disease and aneurysmal dissection. On appropriate oral medications.

## 2023-05-07 NOTE — Progress Notes (Signed)
Patient ID: Daniel Palm., male   DOB: 1946/03/30, 77 y.o.   MRN: [id number]  Chief Complaint  Patient presents with   New Patient (Initial Visit)    np. consult. abominal pain with CT ANGIO ABDOMEN W &/OR WO CONTRAST done 6.20.24 [facility]. referred by sparks, jeffrey.    HPI Daniel Boyer. is a 77 y.o. male.  I am asked to see the patient by Dr. Judithann Sheen for evaluation of a recent CT scan which demonstrated an irregularity in the proximal celiac artery consistent with a small nonflow limiting dissection.  This created only mild narrowing and there was no aneurysmal degeneration.  I have independently reviewed the CT scan.  I would agree with this interpretation.  The patient was having a lot of indigestion and upper abdominal discomfort.  This actually is better since he had the scan back in June.  No unintentional weight loss or food fear.     Past Medical History:  Diagnosis Date   Arthritis    COPD (chronic obstructive pulmonary disease) (HCC)    History of atrial flutter    History of colon polyps    Hypercholesterolemia    Hypertension    Non-cardiac chest pain    Obesity    Sleep apnea     Past Surgical History:  Procedure Laterality Date   ANTRAL WINDOW     COLONOSCOPY N/A 02/18/2015   Procedure: COLONOSCOPY;  Surgeon: Wallace Cullens, MD;  Location: [facility] ENDOSCOPY;  Service: Gastroenterology;  Laterality: N/A;   COLONOSCOPY N/A 02/27/2023   Procedure: COLONOSCOPY;  Surgeon: Toledo, Boykin Nearing, MD;  Location: [facility] ENDOSCOPY;  Service: Gastroenterology;  Laterality: N/A;   COLONOSCOPY WITH PROPOFOL N/A 02/25/2018   Procedure: COLONOSCOPY WITH PROPOFOL;  Surgeon: Toledo, Boykin Nearing, MD;  Location: [facility] ENDOSCOPY;  Service: Gastroenterology;  Laterality: N/A;   ESOPHAGOGASTRODUODENOSCOPY N/A 02/27/2023   Procedure: ESOPHAGOGASTRODUODENOSCOPY (EGD);  Surgeon: Toledo, Boykin Nearing, MD;  Location: [facility] ENDOSCOPY;  Service: Gastroenterology;  Laterality: N/A;   FUNCTIONAL ENDOSCOPIC SINUS  SURGERY     HERNIA REPAIR     JOINT REPLACEMENT Right    KNEE ARTHROSCOPY     TONSILLECTOMY     TOTAL KNEE ARTHROPLASTY Right 05/22/2016   Procedure: TOTAL KNEE ARTHROPLASTY;  Surgeon: Kennedy Bucker, MD;  Location: [facility] ORS;  Service: Orthopedics;  Laterality: Right;   UMBILICAL HERNIA REPAIR      Family History No bleeding disorders, clotting disorders, autoimmune diseases, or aneurysms   Social History   Tobacco Use   Smoking status: Every Day    Current packs/day: 1.00    Average packs/day: 1 pack/day for 50.0 years (50.0 ttl pk-yrs)    Types: Cigarettes   Smokeless tobacco: Never  Vaping Use   Vaping status: Never Used  Substance Use Topics   Alcohol use: No   Drug use: No     Allergies  Allergen Reactions   Celebrex [Celecoxib]     Current Outpatient Medications  Medication Sig Dispense Refill   albuterol (VENTOLIN HFA) 108 (90 Base) MCG/ACT inhaler Inhale 2 puffs into the lungs every 6 (six) hours as needed for wheezing or shortness of breath. 8 g 2   aspirin 81 MG chewable tablet Chew 81 mg by mouth daily.     budesonide-formoterol (SYMBICORT) 160-4.5 MCG/ACT inhaler Inhale 2 puffs into the lungs 2 (two) times daily.     calcium carbonate (TUMS - DOSED IN MG ELEMENTAL CALCIUM) 500 MG chewable tablet Chew 1 tablet by mouth daily.  Fish Oil OIL Take 2 capsules by mouth daily. 500 mg     fluticasone (FLONASE) 50 MCG/ACT nasal spray Place 1 spray into both nostrils daily.     ibuprofen (ADVIL,MOTRIN) 200 MG tablet Take 400 mg by mouth every 6 (six) hours as needed.     metoprolol succinate (TOPROL-XL) 100 MG 24 hr tablet Take 1 tablet by mouth daily.  1   OMEGA-3 FATTY ACIDS PO Take 1,000 mg by mouth 2 (two) times daily.     omeprazole (PRILOSEC) 20 MG capsule Take 20 mg by mouth daily.     pravastatin (PRAVACHOL) 40 MG tablet Take 40 mg by mouth at bedtime.   0   simethicone (MYLICON) 80 MG chewable tablet Chew 80 mg by mouth every 6 (six) hours as needed for  flatulence.     tadalafil (CIALIS) 5 MG tablet Take 5 mg by mouth daily.     triamterene-hydrochlorothiazide (DYAZIDE) 37.5-25 MG capsule Take 1 capsule by mouth daily.     No current facility-administered medications for this visit.      REVIEW OF SYSTEMS (Negative unless checked)  Constitutional: [] Weight loss  [] Fever  [] Chills Cardiac: [] Chest pain   [] Chest pressure   [] Palpitations   [] Shortness of breath when laying flat   [] Shortness of breath at rest   [x] Shortness of breath with exertion. Vascular:  [] Pain in legs with walking   [] Pain in legs at rest   [] Pain in legs when laying flat   [] Claudication   [] Pain in feet when walking  [] Pain in feet at rest  [] Pain in feet when laying flat   [] History of DVT   [] Phlebitis   [] Swelling in legs   [] Varicose veins   [] Non-healing ulcers Pulmonary:   [] Uses home oxygen   [] Productive cough   [] Hemoptysis   [] Wheeze  [x] COPD   [] Asthma Neurologic:  [] Dizziness  [] Blackouts   [] Seizures   [] History of stroke   [] History of TIA  [] Aphasia   [] Temporary blindness   [] Dysphagia   [] Weakness or numbness in arms   [] Weakness or numbness in legs Musculoskeletal:  [x] Arthritis   [] Joint swelling   [] Joint pain   [] Low back pain Hematologic:  [] Easy bruising  [] Easy bleeding   [] Hypercoagulable state   [] Anemic  [] Hepatitis Gastrointestinal:  [] Blood in stool   [] Vomiting blood  [x] Gastroesophageal reflux/heartburn   [x] Abdominal pain Genitourinary:  [] Chronic kidney disease   [] Difficult urination  [] Frequent urination  [] Burning with urination   [] Hematuria Skin:  [] Rashes   [] Ulcers   [] Wounds Psychological:  [] History of anxiety   []  History of major depression.    Physical Exam BP 110/73 (BP Location: Left Arm)   Pulse 63   Resp 18   Ht 5\' 11"  (1.803 m)   Wt 283 lb 9.6 oz (128.6 kg)   BMI 39.55 kg/m  Gen:  WD/WN, NAD. Obese  Head: [place]/AT, No temporalis wasting.  Ear/Nose/Throat: Hearing grossly intact, nares w/o erythema or drainage,  oropharynx w/o Erythema/Exudate Eyes: Conjunctiva clear, sclera non-icteric  Neck: trachea midline.  No JVD.  Pulmonary:  Good air movement, respirations not labored, no use of accessory muscles  Cardiac: RRR, no JVD Vascular:  Vessel Right Left  Radial Palpable Palpable                                   Gastrointestinal:. No masses, surgical incisions, or scars. Non tender Musculoskeletal: M/S 5/5 throughout.  Extremities without ischemic changes.  No deformity or atrophy. Mild LE edema. Neurologic: Sensation grossly intact in extremities.  Symmetrical.  Speech is fluent. Motor exam as listed above. Psychiatric: Judgment intact, Mood & affect appropriate for pt's clinical situation. Dermatologic: No rashes or ulcers noted.  No cellulitis or open wounds.    Radiology No results found.  Labs No results found for this or any previous visit (from the past 2160 hour(s)).  Assessment/Plan:  Celiac artery dissection (HCC) The patient has undergone recent CT scan which demonstrated an irregularity in the proximal celiac artery consistent with a small nonflow limiting dissection.  This created only mild narrowing and there was no aneurysmal degeneration.  I have independently reviewed the CT scan.  I would agree with this interpretation. 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  This is not likely the cause of his current symptoms, but we will follow this for degeneration to aneurysmal disease or worsening occlusive disease.  I discussed the signs and symptoms to be aware of.  I will plan a 13-month follow-up with duplex in our office.  Hypertension blood pressure control important in reducing the progression of atherosclerotic disease and aneurysmal dissection. On appropriate oral medications.   Hypercholesterolemia lipid control important in reducing the progression of atherosclerotic  disease. Continue statin therapy      Festus Barren 05/07/2023, 12:36 PM   This note was created with Dragon medical transcription system.  Any errors from dictation are unintentional.

## 2023-05-07 NOTE — Assessment & Plan Note (Signed)
The patient has undergone recent CT scan which demonstrated an irregularity in the proximal celiac artery consistent with a small nonflow limiting dissection.  This created only mild narrowing and there was no aneurysmal degeneration.  I have independently reviewed the CT scan.  I would agree with this interpretation. 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  This is not likely the cause of his current symptoms, but we will follow this for degeneration to aneurysmal disease or worsening occlusive disease.  I discussed the signs and symptoms to be aware of.  I will plan a 41-month follow-up with duplex in our office.

## 2023-11-26 ENCOUNTER — Encounter (INDEPENDENT_AMBULATORY_CARE_PROVIDER_SITE_OTHER): Payer: Self-pay | Admitting: Vascular Surgery

## 2023-11-26 ENCOUNTER — Ambulatory Visit (INDEPENDENT_AMBULATORY_CARE_PROVIDER_SITE_OTHER): Payer: BC Managed Care – PPO | Admitting: Vascular Surgery

## 2023-11-26 ENCOUNTER — Ambulatory Visit (INDEPENDENT_AMBULATORY_CARE_PROVIDER_SITE_OTHER): Payer: BC Managed Care – PPO

## 2023-11-26 VITALS — BP 128/81 | HR 60 | Resp 18 | Ht 71.0 in | Wt 284.0 lb

## 2023-11-26 DIAGNOSIS — I10 Essential (primary) hypertension: Secondary | ICD-10-CM

## 2023-11-26 DIAGNOSIS — I7779 Dissection of other artery: Secondary | ICD-10-CM

## 2023-11-26 DIAGNOSIS — E7800 Pure hypercholesterolemia, unspecified: Secondary | ICD-10-CM

## 2023-11-26 NOTE — Progress Notes (Signed)
 MRN : [id number]  Daniel Boyer. is a 78 y.o. (May 08, 1946) male who presents with chief complaint of  Chief Complaint  Patient presents with   Follow-up    6 month follow up.  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  He is doing well today and has no complaints of postprandial abdominal pain, unintentional weight loss, or food fear.  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  Duplex findings today show some moderately elevated velocities in the celiac artery likely just into the 70 to 99% range by duplex criteria.  The SMA appeared widely patent.  Current Outpatient Medications  Medication Sig Dispense Refill   albuterol (VENTOLIN HFA) 108 (90 Base) MCG/ACT inhaler Inhale 2 puffs into the lungs every 6 (six) hours as needed for wheezing or shortness of breath. 8 g 2   aspirin 81 MG chewable tablet Chew 81 mg by mouth daily.     budesonide-formoterol (SYMBICORT) 160-4.5 MCG/ACT inhaler Inhale 2 puffs into the lungs 2 (two) times daily.     calcium carbonate (TUMS - DOSED IN MG ELEMENTAL CALCIUM) 500 MG chewable tablet Chew 1 tablet by mouth daily.     Fish Oil OIL Take 2 capsules by mouth daily. 500 mg     fluticasone (FLONASE) 50 MCG/ACT nasal spray Place 1 spray into both nostrils daily.     ibuprofen (ADVIL,MOTRIN) 200 MG tablet Take 400 mg by mouth every 6 (six) hours as needed.     metoprolol succinate (TOPROL-XL) 100 MG 24 hr tablet Take 1 tablet by mouth daily.  1   OMEGA-3 FATTY ACIDS PO Take 1,000 mg by mouth 2 (two) times daily.     omeprazole (PRILOSEC) 20 MG capsule Take 20 mg by mouth daily.     pravastatin (PRAVACHOL) 40 MG tablet Take 40 mg by mouth at bedtime.   0   simethicone (MYLICON) 80 MG chewable tablet Chew 80 mg by mouth every 6 (six) hours as needed for flatulence.     tadalafil (CIALIS) 5 MG tablet Take 5 mg  by mouth daily.     triamterene-hydrochlorothiazide (DYAZIDE) 37.5-25 MG capsule Take 1 capsule by mouth daily.     No current facility-administered medications for this visit.    Past Medical History:  Diagnosis Date   Arthritis    COPD (chronic obstructive pulmonary disease) (HCC)    History of atrial flutter    History of colon polyps    Hypercholesterolemia    Hypertension    Non-cardiac chest pain    Obesity    Sleep apnea     Past Surgical History:  Procedure Laterality Date   ANTRAL WINDOW     COLONOSCOPY N/A 02/18/2015   Procedure: COLONOSCOPY;  Surgeon: Wallace Cullens, MD;  Location: [facility] ENDOSCOPY;  Service: Gastroenterology;  Laterality: N/A;   COLONOSCOPY N/A 02/27/2023   Procedure: COLONOSCOPY;  Surgeon: Toledo, Boykin Nearing, MD;  Location: [facility] ENDOSCOPY;  Service: Gastroenterology;  Laterality: N/A;   COLONOSCOPY WITH PROPOFOL N/A 02/25/2018   Procedure: COLONOSCOPY WITH PROPOFOL;  Surgeon: Toledo, Boykin Nearing, MD;  Location: [facility] ENDOSCOPY;  Service: Gastroenterology;  Laterality: N/A;   ESOPHAGOGASTRODUODENOSCOPY N/A 02/27/2023   Procedure: ESOPHAGOGASTRODUODENOSCOPY (EGD);  Surgeon: Toledo, Boykin Nearing, MD;  Location: [facility] ENDOSCOPY;  Service: Gastroenterology;  Laterality: N/A;   FUNCTIONAL ENDOSCOPIC SINUS SURGERY  HERNIA REPAIR     JOINT REPLACEMENT Right    KNEE ARTHROSCOPY     TONSILLECTOMY     TOTAL KNEE ARTHROPLASTY Right 05/22/2016   Procedure: TOTAL KNEE ARTHROPLASTY;  Surgeon: Kennedy Bucker, MD;  Location: [facility] ORS;  Service: Orthopedics;  Laterality: Right;   UMBILICAL HERNIA REPAIR       Social History   Tobacco Use   Smoking status: Every Day    Current packs/day: 1.00    Average packs/day: 1 pack/day for 50.0 years (50.0 ttl pk-yrs)    Types: Cigarettes   Smokeless tobacco: Never  Vaping Use   Vaping status: Never Used  Substance Use Topics   Alcohol use: No   Drug use: No      History reviewed. No pertinent family history.   Allergies   Allergen Reactions   Celebrex [Celecoxib]      REVIEW OF SYSTEMS (Negative unless checked)   Constitutional: [] Weight loss  [] Fever  [] Chills Cardiac: [] Chest pain   [] Chest pressure   [] Palpitations   [] Shortness of breath when laying flat   [] Shortness of breath at rest   [x] Shortness of breath with exertion. Vascular:  [] Pain in legs with walking   [] Pain in legs at rest   [] Pain in legs when laying flat   [] Claudication   [] Pain in feet when walking  [] Pain in feet at rest  [] Pain in feet when laying flat   [] History of DVT   [] Phlebitis   [] Swelling in legs   [] Varicose veins   [] Non-healing ulcers Pulmonary:   [] Uses home oxygen   [] Productive cough   [] Hemoptysis   [] Wheeze  [x] COPD   [] Asthma Neurologic:  [] Dizziness  [] Blackouts   [] Seizures   [] History of stroke   [] History of TIA  [] Aphasia   [] Temporary blindness   [] Dysphagia   [] Weakness or numbness in arms   [] Weakness or numbness in legs Musculoskeletal:  [x] Arthritis   [] Joint swelling   [] Joint pain   [] Low back pain Hematologic:  [] Easy bruising  [] Easy bleeding   [] Hypercoagulable state   [] Anemic  [] Hepatitis Gastrointestinal:  [] Blood in stool   [] Vomiting blood  [x] Gastroesophageal reflux/heartburn   [] Abdominal pain Genitourinary:  [] Chronic kidney disease   [] Difficult urination  [] Frequent urination  [] Burning with urination   [] Hematuria Skin:  [] Rashes   [] Ulcers   [] Wounds Psychological:  [] History of anxiety   []  History of major depression.   Physical Examination  BP 128/81   Pulse 60   Resp 18   Ht 5\' 11"  (1.803 m)   Wt 284 lb (128.8 kg)   BMI 39.61 kg/m  Gen:  WD/WN, NAD. Obese  Head: [place]/AT, No temporalis wasting. Ear/Nose/Throat: Hearing grossly intact, nares w/o erythema or drainage Eyes: Conjunctiva clear. Sclera non-icteric Neck: Supple.  Trachea midline Pulmonary:  Good air movement, no use of accessory muscles.  Cardiac: RRR, no JVD Vascular:  Vessel Right Left  Radial Palpable Palpable                                    Gastrointestinal: soft, non-tender/non-distended. No guarding/reflex.  Musculoskeletal: M/S 5/5 throughout.  No deformity or atrophy.  Neurologic: Sensation grossly intact in extremities.  Symmetrical.  Speech is fluent.  Psychiatric: Judgment intact, Mood & affect appropriate for pt's clinical situation. Dermatologic: No rashes or ulcers noted.  No cellulitis or open wounds.      Labs No results found for  this or any previous visit (from the past 2160 hours).  Radiology No results found.  Assessment/Plan  Celiac artery dissection (HCC) Duplex findings today show some moderately elevated velocities in the celiac artery likely just into the 70 to 99% range by duplex criteria.  The SMA appeared widely patent.  No current or worrisome symptoms.  No sign of aneurysmal degeneration.  No sign for intervention at this point.  At this point, I think we can go to checking this annually.  Remain on aspirin and a statin agent.  Hypertension blood pressure control important in reducing the progression of atherosclerotic disease and aneurysmal dissection. On appropriate oral medications.     Hypercholesterolemia lipid control important in reducing the progression of atherosclerotic disease. Continue statin therapy  Festus Barren, MD  11/26/2023 1:31 PM    This note was created with Dragon medical transcription system.  Any errors from dictation are purely unintentional

## 2023-11-26 NOTE — Assessment & Plan Note (Signed)
 Duplex findings today show some moderately elevated velocities in the celiac artery likely just into the 70 to 99% range by duplex criteria.  The SMA appeared widely patent.  No current or worrisome symptoms.  No sign of aneurysmal degeneration.  No sign for intervention at this point.  At this point, I think we can go to checking this annually.  Remain on aspirin and a statin agent.

## 2023-12-24 ENCOUNTER — Other Ambulatory Visit: Payer: Self-pay | Admitting: Internal Medicine

## 2023-12-24 DIAGNOSIS — J849 Interstitial pulmonary disease, unspecified: Secondary | ICD-10-CM

## 2023-12-31 ENCOUNTER — Ambulatory Visit
Admission: RE | Admit: 2023-12-31 | Discharge: 2023-12-31 | Disposition: A | Discharge status: Home / Self Care | Source: Ambulatory Visit | Attending: Internal Medicine | Admitting: Internal Medicine

## 2023-12-31 DIAGNOSIS — J849 Interstitial pulmonary disease, unspecified: Secondary | ICD-10-CM | POA: Insufficient documentation

## 2024-02-18 ENCOUNTER — Encounter (INDEPENDENT_AMBULATORY_CARE_PROVIDER_SITE_OTHER): Payer: Self-pay

## 2024-11-24 ENCOUNTER — Encounter (INDEPENDENT_AMBULATORY_CARE_PROVIDER_SITE_OTHER): Payer: BC Managed Care – PPO

## 2024-11-24 ENCOUNTER — Ambulatory Visit (INDEPENDENT_AMBULATORY_CARE_PROVIDER_SITE_OTHER): Payer: BC Managed Care – PPO | Admitting: Vascular Surgery
